# Patient Record
Sex: Female | Born: 1960
Health system: Southern US, Community
[De-identification: ages and names within clinical notes are randomized; demographics above are authoritative.]

## PROBLEM LIST (undated history)

## (undated) DIAGNOSIS — I780 Hereditary hemorrhagic telangiectasia: Secondary | ICD-10-CM

## (undated) DIAGNOSIS — E7800 Pure hypercholesterolemia, unspecified: Secondary | ICD-10-CM

## (undated) DIAGNOSIS — D496 Neoplasm of unspecified behavior of brain: Secondary | ICD-10-CM

## (undated) DIAGNOSIS — D649 Anemia, unspecified: Secondary | ICD-10-CM

## (undated) DIAGNOSIS — I639 Cerebral infarction, unspecified: Secondary | ICD-10-CM

## (undated) DIAGNOSIS — M1990 Unspecified osteoarthritis, unspecified site: Secondary | ICD-10-CM

## (undated) DIAGNOSIS — C801 Malignant (primary) neoplasm, unspecified: Secondary | ICD-10-CM

## (undated) HISTORY — PX: BRAIN TUMOR EXCISION: SHX577

## (undated) HISTORY — PX: ABDOMINAL HYSTERECTOMY: SHX81

---

## 2007-07-19 ENCOUNTER — Ambulatory Visit: Payer: Self-pay | Admitting: Cardiology

## 2007-09-29 ENCOUNTER — Ambulatory Visit (HOSPITAL_COMMUNITY): Admission: RE | Admit: 2007-09-29 | Discharge: 2007-09-29 | Payer: Self-pay | Admitting: Urology

## 2010-06-16 NOTE — Op Note (Signed)
NAMEKAMALJIT, HIZER              ACCOUNT NO.:  [id number]   MEDICAL RECORD NO.:  [id number]          PATIENT TYPE:  AMB   LOCATION:  DAY                           FACILITY:  [facility]   PHYSICIAN:  Ky Barban, M.D.DATE OF BIRTH:  1960/12/06   DATE OF PROCEDURE:  DATE OF DISCHARGE:                               OPERATIVE REPORT   PREOPERATIVE DIAGNOSES:  1. Recurrent urinary tract infection.  2. Bladder neck polyps.   POSTOPERATIVE DIAGNOSES:  1. Recurrent urinary tract infection.  2. Bladder neck polyps.  3. Urethral stenosis.   PROCEDURE:  1. Cystoscopy.  2. Fulguration of bladder neck polyps.  3. Dilation of urethra.   ANESTHESIA:  General.   PROCEDURE:  The patient underwent general endotracheal anesthesia in  lithotomy position.  Usual prep and drape.  A #25 cystoscope introduced  into the bladder.  It was thoroughly inspected.  No tumor, stone,  foreign body, or inflammation seen.  Both ureteral orifices were located  at normal side with a clear efflux.  The bladder neck shows chronic  inflammatory changes extending into the proximal urethra.  There are  chronic inflammatory polyps hanging from the bladder neck.  Using the  [facility] electrode, the bladder neck was circumferentially fulgurated.  Then, the urethra, which was calibrated with 16-French, was dilated with  32-French with straight sounds.  Bimanual pelvic exam was done, which  was unremarkable.  All of the instruments were removed.  The patient  left the operating room in satisfactory condition.      Ky Barban, M.D.  Electronically Signed     MIJ/MEDQ  D:  09/29/2007  T:  09/30/2007  Job:  [id number]

## 2010-06-16 NOTE — Consult Note (Signed)
Danielle, [name]              ACCOUNT NO.:  [id number]   MEDICAL RECORD NO.:  [id number]          PATIENT TYPE:  AMB   LOCATION:  DAY                           FACILITY:  [facility]   PHYSICIAN:  Ky Barban, M.D.DATE OF BIRTH:  03/10/1960   DATE OF CONSULTATION:  DATE OF DISCHARGE:                                 CONSULTATION   This patient is coming in the morning to have cystoscopy done under  anesthesia.   HISTORY:  A 50 year old female who is having recurrent urinary tract  infection, history of gross hematuria.  Workup with CT abdomen and  pelvis without contrast was negative.  Cystoscopy shows that she has a  bladder neck polyp.  She has several UTIs with positive cultures.  She  also had gross hematuria, so I have advised her to undergo fulguration  of this polyp.  She is coming as an outpatient under anesthesia.  We  will go ahead and fulgurate this polyp.   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  She has several other AV malformations in various organs.  She had  history of GI bleed and nose bleeding, etc.  No history of diabetes or  hypertension.   PAST SURGICAL HISTORY:  She had a stone basket done 15 years ago.   PERSONAL HISTORY:  Does not smoke or drink.   REVIEW OF SYSTEMS:  Unremarkable.   PHYSICAL EXAMINATION:  VITAL SIGNS:  Blood pressure 91/53, temperature  98.2.  CENTRAL NERVOUS SYSTEM:  Negative.  HEAD, NECK, EYES, AND ENT:  Negative.  CHEST:  Symmetrical.  HEART:  Regular sinus rhythm, no murmur.  ABDOMEN:  Soft, flat.  Liver, spleen, and kidneys are not palpable.  No  CVA tenderness.  PELVIC:  No adnexal mass or tenderness.   IMPRESSION:  Recurrent urinary tract infections.   PLAN:  Cystofulguration of bladder neck polyps.  It will be done under  the anesthesia as an outpatient.   NOTE:  She is allergic to SULFA drugs.      Ky Barban, M.D.  Electronically Signed     MIJ/MEDQ  D:  09/28/2007   T:  09/29/2007  Job:  [id number]

## 2012-08-03 DIAGNOSIS — R079 Chest pain, unspecified: Secondary | ICD-10-CM

## 2014-05-20 ENCOUNTER — Emergency Department (HOSPITAL_COMMUNITY): Payer: BLUE CROSS/BLUE SHIELD

## 2014-05-20 ENCOUNTER — Emergency Department (HOSPITAL_COMMUNITY)
Admission: EM | Admit: 2014-05-20 | Discharge: 2014-05-20 | Disposition: A | Discharge status: Home / Self Care | Payer: BLUE CROSS/BLUE SHIELD | Attending: Emergency Medicine | Admitting: Emergency Medicine

## 2014-05-20 ENCOUNTER — Encounter (HOSPITAL_COMMUNITY): Payer: Self-pay | Admitting: Emergency Medicine

## 2014-05-20 DIAGNOSIS — S73102A Unspecified sprain of left hip, initial encounter: Secondary | ICD-10-CM | POA: Diagnosis not present

## 2014-05-20 DIAGNOSIS — M25552 Pain in left hip: Secondary | ICD-10-CM

## 2014-05-20 DIAGNOSIS — Z8679 Personal history of other diseases of the circulatory system: Secondary | ICD-10-CM | POA: Diagnosis not present

## 2014-05-20 DIAGNOSIS — Y9389 Activity, other specified: Secondary | ICD-10-CM | POA: Diagnosis not present

## 2014-05-20 DIAGNOSIS — S3992XA Unspecified injury of lower back, initial encounter: Secondary | ICD-10-CM | POA: Insufficient documentation

## 2014-05-20 DIAGNOSIS — S32020A Wedge compression fracture of second lumbar vertebra, initial encounter for closed fracture: Secondary | ICD-10-CM | POA: Diagnosis not present

## 2014-05-20 DIAGNOSIS — Z8739 Personal history of other diseases of the musculoskeletal system and connective tissue: Secondary | ICD-10-CM | POA: Diagnosis not present

## 2014-05-20 DIAGNOSIS — Z862 Personal history of diseases of the blood and blood-forming organs and certain disorders involving the immune mechanism: Secondary | ICD-10-CM | POA: Insufficient documentation

## 2014-05-20 DIAGNOSIS — M549 Dorsalgia, unspecified: Secondary | ICD-10-CM

## 2014-05-20 DIAGNOSIS — Z86011 Personal history of benign neoplasm of the brain: Secondary | ICD-10-CM | POA: Diagnosis not present

## 2014-05-20 DIAGNOSIS — Z8639 Personal history of other endocrine, nutritional and metabolic disease: Secondary | ICD-10-CM | POA: Insufficient documentation

## 2014-05-20 DIAGNOSIS — Y92410 Unspecified street and highway as the place of occurrence of the external cause: Secondary | ICD-10-CM | POA: Diagnosis not present

## 2014-05-20 DIAGNOSIS — Y998 Other external cause status: Secondary | ICD-10-CM | POA: Diagnosis not present

## 2014-05-20 DIAGNOSIS — S32000A Wedge compression fracture of unspecified lumbar vertebra, initial encounter for closed fracture: Secondary | ICD-10-CM

## 2014-05-20 DIAGNOSIS — Z8673 Personal history of transient ischemic attack (TIA), and cerebral infarction without residual deficits: Secondary | ICD-10-CM | POA: Insufficient documentation

## 2014-05-20 DIAGNOSIS — Z859 Personal history of malignant neoplasm, unspecified: Secondary | ICD-10-CM | POA: Diagnosis not present

## 2014-05-20 DIAGNOSIS — S79912A Unspecified injury of left hip, initial encounter: Secondary | ICD-10-CM | POA: Diagnosis present

## 2014-05-20 HISTORY — DX: Cerebral infarction, unspecified: I63.9

## 2014-05-20 HISTORY — DX: Pure hypercholesterolemia, unspecified: E78.00

## 2014-05-20 HISTORY — DX: Neoplasm of unspecified behavior of brain: D49.6

## 2014-05-20 HISTORY — DX: Anemia, unspecified: D64.9

## 2014-05-20 HISTORY — DX: Hereditary hemorrhagic telangiectasia: I78.0

## 2014-05-20 HISTORY — DX: Malignant (primary) neoplasm, unspecified: C80.1

## 2014-05-20 HISTORY — DX: Unspecified osteoarthritis, unspecified site: M19.90

## 2014-05-20 MED ORDER — ONDANSETRON 8 MG PO TBDP
8.0000 mg | ORAL_TABLET | Freq: Once | ORAL | Status: AC
  Administered 2014-05-20: 8 mg via ORAL
  Filled 2014-05-20: qty 1

## 2014-05-20 MED ORDER — HYDROMORPHONE HCL 1 MG/ML IJ SOLN
1.0000 mg | Freq: Once | INTRAMUSCULAR | Status: AC
Start: 2014-05-20 — End: 2014-05-20
  Administered 2014-05-20: 1 mg via INTRAMUSCULAR
  Filled 2014-05-20: qty 1

## 2014-05-20 MED ORDER — OXYCODONE-ACETAMINOPHEN 5-325 MG PO TABS: 1.0000 | ORAL_TABLET | ORAL | Status: AC | PRN

## 2014-05-20 MED ORDER — ONDANSETRON 4 MG PO TBDP
ORAL_TABLET | ORAL | Status: AC
  Filled 2014-05-20: qty 1

## 2014-05-20 MED ORDER — OXYCODONE-ACETAMINOPHEN 5-325 MG PO TABS
1.0000 | ORAL_TABLET | Freq: Once | ORAL | Status: AC
  Administered 2014-05-20: 1 via ORAL
  Filled 2014-05-20: qty 1
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
## 2014-05-20 NOTE — ED Notes (Signed)
MD at bedside. 
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
## 2014-05-20 NOTE — ED Provider Notes (Signed)
Pt up to bedside commode.   C/o Back pain.  No weakness, numbness.  Just given IM meds.  Plan will be pain control, home, rest, NS follow up do discuss TLSO or Kyphoplasty if pain persists.   Tanna Furry, MD 05/20/14 [phone]

## 2017-07-14 DIAGNOSIS — Z01419 Encounter for gynecological examination (general) (routine) without abnormal findings: Secondary | ICD-10-CM | POA: Diagnosis not present

## 2017-08-18 DIAGNOSIS — D519 Vitamin B12 deficiency anemia, unspecified: Secondary | ICD-10-CM | POA: Diagnosis not present

## 2017-08-18 DIAGNOSIS — Z1231 Encounter for screening mammogram for malignant neoplasm of breast: Secondary | ICD-10-CM | POA: Diagnosis not present

## 2017-08-18 DIAGNOSIS — R5382 Chronic fatigue, unspecified: Secondary | ICD-10-CM | POA: Diagnosis not present

## 2017-08-18 DIAGNOSIS — D509 Iron deficiency anemia, unspecified: Secondary | ICD-10-CM | POA: Diagnosis not present

## 2017-08-18 DIAGNOSIS — E559 Vitamin D deficiency, unspecified: Secondary | ICD-10-CM | POA: Diagnosis not present

## 2017-08-24 DIAGNOSIS — L570 Actinic keratosis: Secondary | ICD-10-CM | POA: Diagnosis not present

## 2017-08-25 DIAGNOSIS — Z6823 Body mass index (BMI) 23.0-23.9, adult: Secondary | ICD-10-CM | POA: Diagnosis not present

## 2017-08-25 DIAGNOSIS — Z0001 Encounter for general adult medical examination with abnormal findings: Secondary | ICD-10-CM | POA: Diagnosis not present

## 2017-08-25 DIAGNOSIS — Z23 Encounter for immunization: Secondary | ICD-10-CM | POA: Diagnosis not present

## 2017-10-13 DIAGNOSIS — Q282 Arteriovenous malformation of cerebral vessels: Secondary | ICD-10-CM | POA: Diagnosis not present

## 2017-10-13 DIAGNOSIS — D333 Benign neoplasm of cranial nerves: Secondary | ICD-10-CM | POA: Diagnosis not present

## 2017-10-13 DIAGNOSIS — Z483 Aftercare following surgery for neoplasm: Secondary | ICD-10-CM | POA: Diagnosis not present

## 2017-10-13 DIAGNOSIS — I82891 Chronic embolism and thrombosis of other specified veins: Secondary | ICD-10-CM | POA: Diagnosis not present

## 2017-10-26 DIAGNOSIS — R040 Epistaxis: Secondary | ICD-10-CM | POA: Diagnosis not present

## 2017-10-26 DIAGNOSIS — D500 Iron deficiency anemia secondary to blood loss (chronic): Secondary | ICD-10-CM | POA: Diagnosis not present

## 2017-11-02 DIAGNOSIS — J342 Deviated nasal septum: Secondary | ICD-10-CM | POA: Diagnosis not present

## 2017-11-02 DIAGNOSIS — R040 Epistaxis: Secondary | ICD-10-CM | POA: Diagnosis not present

## 2017-11-22 DIAGNOSIS — J342 Deviated nasal septum: Secondary | ICD-10-CM | POA: Diagnosis not present

## 2017-11-22 DIAGNOSIS — R040 Epistaxis: Secondary | ICD-10-CM | POA: Diagnosis not present

## 2017-11-28 DIAGNOSIS — J342 Deviated nasal septum: Secondary | ICD-10-CM | POA: Diagnosis not present

## 2017-11-28 DIAGNOSIS — R040 Epistaxis: Secondary | ICD-10-CM | POA: Diagnosis not present

## 2017-12-06 DIAGNOSIS — Z79899 Other long term (current) drug therapy: Secondary | ICD-10-CM | POA: Diagnosis not present

## 2017-12-07 DIAGNOSIS — R040 Epistaxis: Secondary | ICD-10-CM | POA: Diagnosis not present

## 2017-12-07 DIAGNOSIS — Z23 Encounter for immunization: Secondary | ICD-10-CM | POA: Diagnosis not present

## 2017-12-09 DIAGNOSIS — R002 Palpitations: Secondary | ICD-10-CM | POA: Diagnosis not present

## 2017-12-09 DIAGNOSIS — E7800 Pure hypercholesterolemia, unspecified: Secondary | ICD-10-CM | POA: Diagnosis not present

## 2017-12-21 DIAGNOSIS — R300 Dysuria: Secondary | ICD-10-CM | POA: Diagnosis not present

## 2018-01-04 DIAGNOSIS — N390 Urinary tract infection, site not specified: Secondary | ICD-10-CM | POA: Diagnosis not present

## 2018-01-04 DIAGNOSIS — Z87440 Personal history of urinary (tract) infections: Secondary | ICD-10-CM | POA: Diagnosis not present

## 2018-01-05 DIAGNOSIS — R002 Palpitations: Secondary | ICD-10-CM | POA: Diagnosis not present

## 2018-01-18 DIAGNOSIS — R3912 Poor urinary stream: Secondary | ICD-10-CM | POA: Diagnosis not present

## 2018-01-18 DIAGNOSIS — R3915 Urgency of urination: Secondary | ICD-10-CM | POA: Diagnosis not present

## 2018-01-23 DIAGNOSIS — R002 Palpitations: Secondary | ICD-10-CM | POA: Diagnosis not present

## 2018-02-05 DIAGNOSIS — I491 Atrial premature depolarization: Secondary | ICD-10-CM | POA: Diagnosis not present

## 2018-02-05 DIAGNOSIS — R002 Palpitations: Secondary | ICD-10-CM | POA: Diagnosis not present

## 2018-02-05 DIAGNOSIS — I493 Ventricular premature depolarization: Secondary | ICD-10-CM | POA: Diagnosis not present

## 2018-02-09 DIAGNOSIS — K219 Gastro-esophageal reflux disease without esophagitis: Secondary | ICD-10-CM | POA: Diagnosis not present

## 2018-02-09 DIAGNOSIS — D529 Folate deficiency anemia, unspecified: Secondary | ICD-10-CM | POA: Diagnosis not present

## 2018-02-09 DIAGNOSIS — E782 Mixed hyperlipidemia: Secondary | ICD-10-CM | POA: Diagnosis not present

## 2018-02-09 DIAGNOSIS — D519 Vitamin B12 deficiency anemia, unspecified: Secondary | ICD-10-CM | POA: Diagnosis not present

## 2018-02-09 DIAGNOSIS — D509 Iron deficiency anemia, unspecified: Secondary | ICD-10-CM | POA: Diagnosis not present

## 2018-02-14 DIAGNOSIS — G464 Cerebellar stroke syndrome: Secondary | ICD-10-CM | POA: Diagnosis not present

## 2018-02-14 DIAGNOSIS — I770 Arteriovenous fistula, acquired: Secondary | ICD-10-CM | POA: Diagnosis not present

## 2018-02-14 DIAGNOSIS — D509 Iron deficiency anemia, unspecified: Secondary | ICD-10-CM | POA: Diagnosis not present

## 2018-02-21 DIAGNOSIS — R3915 Urgency of urination: Secondary | ICD-10-CM | POA: Diagnosis not present

## 2018-03-01 DIAGNOSIS — D500 Iron deficiency anemia secondary to blood loss (chronic): Secondary | ICD-10-CM | POA: Diagnosis not present

## 2018-03-01 DIAGNOSIS — Z483 Aftercare following surgery for neoplasm: Secondary | ICD-10-CM | POA: Diagnosis not present

## 2018-03-01 DIAGNOSIS — K5520 Angiodysplasia of colon without hemorrhage: Secondary | ICD-10-CM | POA: Diagnosis not present

## 2018-03-01 DIAGNOSIS — Q282 Arteriovenous malformation of cerebral vessels: Secondary | ICD-10-CM | POA: Diagnosis not present

## 2018-03-15 DIAGNOSIS — K5520 Angiodysplasia of colon without hemorrhage: Secondary | ICD-10-CM | POA: Diagnosis not present

## 2018-03-15 DIAGNOSIS — R040 Epistaxis: Secondary | ICD-10-CM | POA: Diagnosis not present

## 2018-03-29 DIAGNOSIS — I69398 Other sequelae of cerebral infarction: Secondary | ICD-10-CM | POA: Diagnosis not present

## 2018-03-29 DIAGNOSIS — Q282 Arteriovenous malformation of cerebral vessels: Secondary | ICD-10-CM | POA: Diagnosis not present

## 2018-03-29 DIAGNOSIS — G43109 Migraine with aura, not intractable, without status migrainosus: Secondary | ICD-10-CM | POA: Diagnosis not present

## 2018-05-02 DIAGNOSIS — R040 Epistaxis: Secondary | ICD-10-CM | POA: Diagnosis not present

## 2018-05-02 DIAGNOSIS — K5520 Angiodysplasia of colon without hemorrhage: Secondary | ICD-10-CM | POA: Diagnosis not present

## 2018-06-01 DIAGNOSIS — R040 Epistaxis: Secondary | ICD-10-CM | POA: Diagnosis not present

## 2018-06-01 DIAGNOSIS — K5520 Angiodysplasia of colon without hemorrhage: Secondary | ICD-10-CM | POA: Diagnosis not present

## 2018-06-14 DIAGNOSIS — D72819 Decreased white blood cell count, unspecified: Secondary | ICD-10-CM | POA: Diagnosis not present

## 2018-06-21 DIAGNOSIS — J342 Deviated nasal septum: Secondary | ICD-10-CM | POA: Diagnosis not present

## 2018-06-21 DIAGNOSIS — R040 Epistaxis: Secondary | ICD-10-CM | POA: Diagnosis not present

## 2018-07-13 DIAGNOSIS — D72810 Lymphocytopenia: Secondary | ICD-10-CM | POA: Diagnosis not present

## 2018-07-13 DIAGNOSIS — Z118 Encounter for screening for other infectious and parasitic diseases: Secondary | ICD-10-CM | POA: Diagnosis not present

## 2018-07-13 DIAGNOSIS — Z1321 Encounter for screening for nutritional disorder: Secondary | ICD-10-CM | POA: Diagnosis not present

## 2018-07-13 DIAGNOSIS — I780 Hereditary hemorrhagic telangiectasia: Secondary | ICD-10-CM | POA: Diagnosis not present

## 2018-07-13 DIAGNOSIS — D500 Iron deficiency anemia secondary to blood loss (chronic): Secondary | ICD-10-CM | POA: Diagnosis not present

## 2018-07-13 DIAGNOSIS — Z114 Encounter for screening for human immunodeficiency virus [HIV]: Secondary | ICD-10-CM | POA: Diagnosis not present

## 2018-07-13 DIAGNOSIS — D72819 Decreased white blood cell count, unspecified: Secondary | ICD-10-CM | POA: Diagnosis not present

## 2018-08-24 DIAGNOSIS — Z01419 Encounter for gynecological examination (general) (routine) without abnormal findings: Secondary | ICD-10-CM | POA: Diagnosis not present

## 2018-08-24 DIAGNOSIS — Z1212 Encounter for screening for malignant neoplasm of rectum: Secondary | ICD-10-CM | POA: Diagnosis not present

## 2018-08-24 DIAGNOSIS — Z1231 Encounter for screening mammogram for malignant neoplasm of breast: Secondary | ICD-10-CM | POA: Diagnosis not present

## 2018-08-25 DIAGNOSIS — Z0001 Encounter for general adult medical examination with abnormal findings: Secondary | ICD-10-CM | POA: Diagnosis not present

## 2018-08-28 DIAGNOSIS — R3912 Poor urinary stream: Secondary | ICD-10-CM | POA: Diagnosis not present

## 2018-08-28 DIAGNOSIS — R3915 Urgency of urination: Secondary | ICD-10-CM | POA: Diagnosis not present

## 2018-08-28 DIAGNOSIS — Z23 Encounter for immunization: Secondary | ICD-10-CM | POA: Diagnosis not present

## 2018-08-28 DIAGNOSIS — Z6824 Body mass index (BMI) 24.0-24.9, adult: Secondary | ICD-10-CM | POA: Diagnosis not present

## 2018-08-28 DIAGNOSIS — Z0001 Encounter for general adult medical examination with abnormal findings: Secondary | ICD-10-CM | POA: Diagnosis not present

## 2018-08-29 DIAGNOSIS — L570 Actinic keratosis: Secondary | ICD-10-CM | POA: Diagnosis not present

## 2018-09-21 DIAGNOSIS — D500 Iron deficiency anemia secondary to blood loss (chronic): Secondary | ICD-10-CM | POA: Diagnosis not present

## 2018-09-21 DIAGNOSIS — I780 Hereditary hemorrhagic telangiectasia: Secondary | ICD-10-CM | POA: Diagnosis not present

## 2018-09-26 DIAGNOSIS — I69354 Hemiplegia and hemiparesis following cerebral infarction affecting left non-dominant side: Secondary | ICD-10-CM | POA: Diagnosis not present

## 2018-09-26 DIAGNOSIS — E7800 Pure hypercholesterolemia, unspecified: Secondary | ICD-10-CM | POA: Diagnosis not present

## 2018-09-26 DIAGNOSIS — G43109 Migraine with aura, not intractable, without status migrainosus: Secondary | ICD-10-CM | POA: Diagnosis not present

## 2018-09-26 DIAGNOSIS — Z8774 Personal history of (corrected) congenital malformations of heart and circulatory system: Secondary | ICD-10-CM | POA: Diagnosis not present

## 2018-10-12 DIAGNOSIS — G510 Bell's palsy: Secondary | ICD-10-CM | POA: Diagnosis not present

## 2018-10-12 DIAGNOSIS — I780 Hereditary hemorrhagic telangiectasia: Secondary | ICD-10-CM | POA: Diagnosis not present

## 2018-10-12 DIAGNOSIS — K219 Gastro-esophageal reflux disease without esophagitis: Secondary | ICD-10-CM | POA: Diagnosis not present

## 2018-10-12 DIAGNOSIS — H918X2 Other specified hearing loss, left ear: Secondary | ICD-10-CM | POA: Diagnosis not present

## 2018-10-12 DIAGNOSIS — G43909 Migraine, unspecified, not intractable, without status migrainosus: Secondary | ICD-10-CM | POA: Diagnosis not present

## 2018-10-12 DIAGNOSIS — E785 Hyperlipidemia, unspecified: Secondary | ICD-10-CM | POA: Diagnosis not present

## 2018-10-12 DIAGNOSIS — K5790 Diverticulosis of intestine, part unspecified, without perforation or abscess without bleeding: Secondary | ICD-10-CM | POA: Diagnosis not present

## 2018-10-12 DIAGNOSIS — Z8673 Personal history of transient ischemic attack (TIA), and cerebral infarction without residual deficits: Secondary | ICD-10-CM | POA: Diagnosis not present

## 2018-10-12 DIAGNOSIS — D72819 Decreased white blood cell count, unspecified: Secondary | ICD-10-CM | POA: Diagnosis not present

## 2018-10-12 DIAGNOSIS — D500 Iron deficiency anemia secondary to blood loss (chronic): Secondary | ICD-10-CM | POA: Diagnosis not present

## 2018-10-12 DIAGNOSIS — D649 Anemia, unspecified: Secondary | ICD-10-CM | POA: Diagnosis not present

## 2018-10-16 DIAGNOSIS — D500 Iron deficiency anemia secondary to blood loss (chronic): Secondary | ICD-10-CM | POA: Diagnosis not present

## 2018-10-16 DIAGNOSIS — I780 Hereditary hemorrhagic telangiectasia: Secondary | ICD-10-CM | POA: Diagnosis not present

## 2018-11-02 DIAGNOSIS — Z8673 Personal history of transient ischemic attack (TIA), and cerebral infarction without residual deficits: Secondary | ICD-10-CM | POA: Diagnosis not present

## 2018-11-02 DIAGNOSIS — Z79899 Other long term (current) drug therapy: Secondary | ICD-10-CM | POA: Diagnosis not present

## 2018-11-02 DIAGNOSIS — K219 Gastro-esophageal reflux disease without esophagitis: Secondary | ICD-10-CM | POA: Diagnosis not present

## 2018-11-02 DIAGNOSIS — J329 Chronic sinusitis, unspecified: Secondary | ICD-10-CM | POA: Diagnosis not present

## 2018-11-02 DIAGNOSIS — J342 Deviated nasal septum: Secondary | ICD-10-CM | POA: Diagnosis not present

## 2018-11-02 DIAGNOSIS — I780 Hereditary hemorrhagic telangiectasia: Secondary | ICD-10-CM | POA: Diagnosis not present

## 2018-11-02 DIAGNOSIS — Z7722 Contact with and (suspected) exposure to environmental tobacco smoke (acute) (chronic): Secondary | ICD-10-CM | POA: Diagnosis not present

## 2018-11-02 DIAGNOSIS — R040 Epistaxis: Secondary | ICD-10-CM | POA: Diagnosis not present

## 2018-11-02 DIAGNOSIS — K5520 Angiodysplasia of colon without hemorrhage: Secondary | ICD-10-CM | POA: Diagnosis not present

## 2018-11-20 ENCOUNTER — Other Ambulatory Visit: Payer: Self-pay

## 2018-11-20 ENCOUNTER — Other Ambulatory Visit: Payer: Self-pay | Admitting: Family Medicine

## 2018-11-20 ENCOUNTER — Ambulatory Visit (HOSPITAL_COMMUNITY)
Admission: RE | Admit: 2018-11-20 | Discharge: 2018-11-20 | Disposition: A | Discharge status: Home / Self Care | Payer: BC Managed Care – PPO | Source: Ambulatory Visit | Attending: Family Medicine | Admitting: Family Medicine

## 2018-11-20 DIAGNOSIS — M79605 Pain in left leg: Secondary | ICD-10-CM | POA: Diagnosis not present

## 2018-11-20 DIAGNOSIS — M79662 Pain in left lower leg: Secondary | ICD-10-CM | POA: Diagnosis not present

## 2018-11-20 DIAGNOSIS — Z6825 Body mass index (BMI) 25.0-25.9, adult: Secondary | ICD-10-CM | POA: Diagnosis not present

## 2018-11-20 DIAGNOSIS — R52 Pain, unspecified: Secondary | ICD-10-CM | POA: Diagnosis not present

## 2018-12-15 DIAGNOSIS — Q282 Arteriovenous malformation of cerebral vessels: Secondary | ICD-10-CM | POA: Diagnosis not present

## 2018-12-15 DIAGNOSIS — I780 Hereditary hemorrhagic telangiectasia: Secondary | ICD-10-CM | POA: Diagnosis not present

## 2018-12-15 DIAGNOSIS — Z09 Encounter for follow-up examination after completed treatment for conditions other than malignant neoplasm: Secondary | ICD-10-CM | POA: Diagnosis not present

## 2018-12-15 DIAGNOSIS — Z8673 Personal history of transient ischemic attack (TIA), and cerebral infarction without residual deficits: Secondary | ICD-10-CM | POA: Diagnosis not present

## 2018-12-15 DIAGNOSIS — R002 Palpitations: Secondary | ICD-10-CM | POA: Diagnosis not present

## 2018-12-15 DIAGNOSIS — E785 Hyperlipidemia, unspecified: Secondary | ICD-10-CM | POA: Diagnosis not present

## 2018-12-15 DIAGNOSIS — Q2733 Arteriovenous malformation of digestive system vessel: Secondary | ICD-10-CM | POA: Diagnosis not present

## 2018-12-15 DIAGNOSIS — K5520 Angiodysplasia of colon without hemorrhage: Secondary | ICD-10-CM | POA: Diagnosis not present

## 2018-12-15 DIAGNOSIS — D649 Anemia, unspecified: Secondary | ICD-10-CM | POA: Diagnosis not present

## 2018-12-15 DIAGNOSIS — M791 Myalgia, unspecified site: Secondary | ICD-10-CM | POA: Diagnosis not present

## 2018-12-15 DIAGNOSIS — I10 Essential (primary) hypertension: Secondary | ICD-10-CM | POA: Diagnosis not present

## 2018-12-19 DIAGNOSIS — R040 Epistaxis: Secondary | ICD-10-CM | POA: Diagnosis not present

## 2018-12-19 DIAGNOSIS — J343 Hypertrophy of nasal turbinates: Secondary | ICD-10-CM | POA: Diagnosis not present

## 2018-12-19 DIAGNOSIS — R0982 Postnasal drip: Secondary | ICD-10-CM | POA: Diagnosis not present

## 2018-12-19 DIAGNOSIS — J342 Deviated nasal septum: Secondary | ICD-10-CM | POA: Diagnosis not present

## 2018-12-19 DIAGNOSIS — I780 Hereditary hemorrhagic telangiectasia: Secondary | ICD-10-CM | POA: Diagnosis not present

## 2019-01-11 DIAGNOSIS — K5900 Constipation, unspecified: Secondary | ICD-10-CM | POA: Diagnosis not present

## 2019-01-11 DIAGNOSIS — I780 Hereditary hemorrhagic telangiectasia: Secondary | ICD-10-CM | POA: Diagnosis not present

## 2019-01-11 DIAGNOSIS — K6289 Other specified diseases of anus and rectum: Secondary | ICD-10-CM | POA: Diagnosis not present

## 2019-01-11 DIAGNOSIS — K648 Other hemorrhoids: Secondary | ICD-10-CM | POA: Diagnosis not present

## 2019-02-20 DIAGNOSIS — E782 Mixed hyperlipidemia: Secondary | ICD-10-CM | POA: Diagnosis not present

## 2019-02-20 DIAGNOSIS — D519 Vitamin B12 deficiency anemia, unspecified: Secondary | ICD-10-CM | POA: Diagnosis not present

## 2019-02-20 DIAGNOSIS — D649 Anemia, unspecified: Secondary | ICD-10-CM | POA: Diagnosis not present

## 2019-02-20 DIAGNOSIS — D529 Folate deficiency anemia, unspecified: Secondary | ICD-10-CM | POA: Diagnosis not present

## 2019-02-23 DIAGNOSIS — E782 Mixed hyperlipidemia: Secondary | ICD-10-CM | POA: Diagnosis not present

## 2019-02-23 DIAGNOSIS — M5416 Radiculopathy, lumbar region: Secondary | ICD-10-CM | POA: Diagnosis not present

## 2019-02-23 DIAGNOSIS — D509 Iron deficiency anemia, unspecified: Secondary | ICD-10-CM | POA: Diagnosis not present

## 2019-02-23 DIAGNOSIS — D519 Vitamin B12 deficiency anemia, unspecified: Secondary | ICD-10-CM | POA: Diagnosis not present

## 2019-03-08 DIAGNOSIS — I780 Hereditary hemorrhagic telangiectasia: Secondary | ICD-10-CM | POA: Diagnosis not present

## 2019-03-08 DIAGNOSIS — Q2730 Arteriovenous malformation, site unspecified: Secondary | ICD-10-CM | POA: Diagnosis not present

## 2019-03-08 DIAGNOSIS — D500 Iron deficiency anemia secondary to blood loss (chronic): Secondary | ICD-10-CM | POA: Diagnosis not present

## 2019-03-08 DIAGNOSIS — D72819 Decreased white blood cell count, unspecified: Secondary | ICD-10-CM | POA: Diagnosis not present

## 2019-03-12 DIAGNOSIS — D500 Iron deficiency anemia secondary to blood loss (chronic): Secondary | ICD-10-CM | POA: Diagnosis not present

## 2019-03-19 DIAGNOSIS — D500 Iron deficiency anemia secondary to blood loss (chronic): Secondary | ICD-10-CM | POA: Diagnosis not present

## 2019-03-28 DIAGNOSIS — G249 Dystonia, unspecified: Secondary | ICD-10-CM | POA: Diagnosis not present

## 2019-03-28 DIAGNOSIS — I780 Hereditary hemorrhagic telangiectasia: Secondary | ICD-10-CM | POA: Diagnosis not present

## 2019-03-28 DIAGNOSIS — Z7982 Long term (current) use of aspirin: Secondary | ICD-10-CM | POA: Diagnosis not present

## 2019-03-28 DIAGNOSIS — G43109 Migraine with aura, not intractable, without status migrainosus: Secondary | ICD-10-CM | POA: Diagnosis not present

## 2019-03-28 DIAGNOSIS — E785 Hyperlipidemia, unspecified: Secondary | ICD-10-CM | POA: Diagnosis not present

## 2019-03-28 DIAGNOSIS — Z79899 Other long term (current) drug therapy: Secondary | ICD-10-CM | POA: Diagnosis not present

## 2019-03-28 DIAGNOSIS — I63131 Cerebral infarction due to embolism of right carotid artery: Secondary | ICD-10-CM | POA: Diagnosis not present

## 2019-03-28 DIAGNOSIS — I10 Essential (primary) hypertension: Secondary | ICD-10-CM | POA: Diagnosis not present

## 2019-03-28 DIAGNOSIS — I69354 Hemiplegia and hemiparesis following cerebral infarction affecting left non-dominant side: Secondary | ICD-10-CM | POA: Diagnosis not present

## 2019-03-28 DIAGNOSIS — I69819 Unspecified symptoms and signs involving cognitive functions following other cerebrovascular disease: Secondary | ICD-10-CM | POA: Diagnosis not present

## 2019-03-28 DIAGNOSIS — E7800 Pure hypercholesterolemia, unspecified: Secondary | ICD-10-CM | POA: Diagnosis not present

## 2019-03-28 DIAGNOSIS — Z7722 Contact with and (suspected) exposure to environmental tobacco smoke (acute) (chronic): Secondary | ICD-10-CM | POA: Diagnosis not present

## 2019-04-23 DIAGNOSIS — I780 Hereditary hemorrhagic telangiectasia: Secondary | ICD-10-CM | POA: Diagnosis not present

## 2019-05-08 DIAGNOSIS — I780 Hereditary hemorrhagic telangiectasia: Secondary | ICD-10-CM | POA: Diagnosis not present

## 2019-05-08 DIAGNOSIS — Z20822 Contact with and (suspected) exposure to covid-19: Secondary | ICD-10-CM | POA: Diagnosis not present

## 2019-05-08 DIAGNOSIS — Z01812 Encounter for preprocedural laboratory examination: Secondary | ICD-10-CM | POA: Diagnosis not present

## 2019-05-15 DIAGNOSIS — K31811 Angiodysplasia of stomach and duodenum with bleeding: Secondary | ICD-10-CM | POA: Diagnosis not present

## 2019-05-15 DIAGNOSIS — Z7982 Long term (current) use of aspirin: Secondary | ICD-10-CM | POA: Diagnosis not present

## 2019-05-15 DIAGNOSIS — I780 Hereditary hemorrhagic telangiectasia: Secondary | ICD-10-CM | POA: Diagnosis not present

## 2019-05-15 DIAGNOSIS — Q2733 Arteriovenous malformation of digestive system vessel: Secondary | ICD-10-CM | POA: Diagnosis not present

## 2019-05-15 DIAGNOSIS — D500 Iron deficiency anemia secondary to blood loss (chronic): Secondary | ICD-10-CM | POA: Diagnosis not present

## 2019-06-07 DIAGNOSIS — I780 Hereditary hemorrhagic telangiectasia: Secondary | ICD-10-CM | POA: Diagnosis not present

## 2019-06-07 DIAGNOSIS — D72819 Decreased white blood cell count, unspecified: Secondary | ICD-10-CM | POA: Diagnosis not present

## 2019-06-07 DIAGNOSIS — K5520 Angiodysplasia of colon without hemorrhage: Secondary | ICD-10-CM | POA: Diagnosis not present

## 2019-06-07 DIAGNOSIS — D500 Iron deficiency anemia secondary to blood loss (chronic): Secondary | ICD-10-CM | POA: Diagnosis not present

## 2019-06-19 DIAGNOSIS — J342 Deviated nasal septum: Secondary | ICD-10-CM | POA: Diagnosis not present

## 2019-06-19 DIAGNOSIS — Z882 Allergy status to sulfonamides status: Secondary | ICD-10-CM | POA: Diagnosis not present

## 2019-06-19 DIAGNOSIS — R0982 Postnasal drip: Secondary | ICD-10-CM | POA: Diagnosis not present

## 2019-06-19 DIAGNOSIS — I780 Hereditary hemorrhagic telangiectasia: Secondary | ICD-10-CM | POA: Diagnosis not present

## 2019-06-19 DIAGNOSIS — R040 Epistaxis: Secondary | ICD-10-CM | POA: Diagnosis not present

## 2019-06-19 DIAGNOSIS — Z885 Allergy status to narcotic agent status: Secondary | ICD-10-CM | POA: Diagnosis not present

## 2019-06-19 DIAGNOSIS — J343 Hypertrophy of nasal turbinates: Secondary | ICD-10-CM | POA: Diagnosis not present

## 2019-07-05 DIAGNOSIS — D72819 Decreased white blood cell count, unspecified: Secondary | ICD-10-CM | POA: Diagnosis not present

## 2019-07-05 DIAGNOSIS — R040 Epistaxis: Secondary | ICD-10-CM | POA: Diagnosis not present

## 2019-07-05 DIAGNOSIS — D500 Iron deficiency anemia secondary to blood loss (chronic): Secondary | ICD-10-CM | POA: Diagnosis not present

## 2019-07-05 DIAGNOSIS — D649 Anemia, unspecified: Secondary | ICD-10-CM | POA: Diagnosis not present

## 2019-07-05 DIAGNOSIS — Z79899 Other long term (current) drug therapy: Secondary | ICD-10-CM | POA: Diagnosis not present

## 2019-07-05 DIAGNOSIS — I780 Hereditary hemorrhagic telangiectasia: Secondary | ICD-10-CM | POA: Diagnosis not present

## 2019-07-12 DIAGNOSIS — D500 Iron deficiency anemia secondary to blood loss (chronic): Secondary | ICD-10-CM | POA: Diagnosis not present

## 2019-07-12 DIAGNOSIS — R040 Epistaxis: Secondary | ICD-10-CM | POA: Diagnosis not present

## 2019-07-12 DIAGNOSIS — I780 Hereditary hemorrhagic telangiectasia: Secondary | ICD-10-CM | POA: Diagnosis not present

## 2019-07-12 DIAGNOSIS — K5520 Angiodysplasia of colon without hemorrhage: Secondary | ICD-10-CM | POA: Diagnosis not present

## 2019-07-31 DIAGNOSIS — M810 Age-related osteoporosis without current pathological fracture: Secondary | ICD-10-CM | POA: Diagnosis not present

## 2019-07-31 DIAGNOSIS — Z0389 Encounter for observation for other suspected diseases and conditions ruled out: Secondary | ICD-10-CM | POA: Diagnosis not present

## 2019-08-28 DIAGNOSIS — L570 Actinic keratosis: Secondary | ICD-10-CM | POA: Diagnosis not present

## 2019-09-04 DIAGNOSIS — K219 Gastro-esophageal reflux disease without esophagitis: Secondary | ICD-10-CM | POA: Diagnosis not present

## 2019-09-04 DIAGNOSIS — E782 Mixed hyperlipidemia: Secondary | ICD-10-CM | POA: Diagnosis not present

## 2019-09-04 DIAGNOSIS — D529 Folate deficiency anemia, unspecified: Secondary | ICD-10-CM | POA: Diagnosis not present

## 2019-09-04 DIAGNOSIS — D649 Anemia, unspecified: Secondary | ICD-10-CM | POA: Diagnosis not present

## 2019-09-04 DIAGNOSIS — R5382 Chronic fatigue, unspecified: Secondary | ICD-10-CM | POA: Diagnosis not present

## 2019-09-04 DIAGNOSIS — D518 Other vitamin B12 deficiency anemias: Secondary | ICD-10-CM | POA: Diagnosis not present

## 2019-09-04 DIAGNOSIS — E559 Vitamin D deficiency, unspecified: Secondary | ICD-10-CM | POA: Diagnosis not present

## 2019-09-05 DIAGNOSIS — Z1212 Encounter for screening for malignant neoplasm of rectum: Secondary | ICD-10-CM | POA: Diagnosis not present

## 2019-09-05 DIAGNOSIS — Z1272 Encounter for screening for malignant neoplasm of vagina: Secondary | ICD-10-CM | POA: Diagnosis not present

## 2019-09-05 DIAGNOSIS — Z90712 Acquired absence of cervix with remaining uterus: Secondary | ICD-10-CM | POA: Diagnosis not present

## 2019-09-05 DIAGNOSIS — R87620 Atypical squamous cells of undetermined significance on cytologic smear of vagina (ASC-US): Secondary | ICD-10-CM | POA: Diagnosis not present

## 2019-09-05 DIAGNOSIS — Z1231 Encounter for screening mammogram for malignant neoplasm of breast: Secondary | ICD-10-CM | POA: Diagnosis not present

## 2019-09-05 DIAGNOSIS — Z01419 Encounter for gynecological examination (general) (routine) without abnormal findings: Secondary | ICD-10-CM | POA: Diagnosis not present

## 2019-09-07 DIAGNOSIS — Q8503 Schwannomatosis: Secondary | ICD-10-CM | POA: Diagnosis not present

## 2019-09-07 DIAGNOSIS — D519 Vitamin B12 deficiency anemia, unspecified: Secondary | ICD-10-CM | POA: Diagnosis not present

## 2019-09-07 DIAGNOSIS — I770 Arteriovenous fistula, acquired: Secondary | ICD-10-CM | POA: Diagnosis not present

## 2019-09-07 DIAGNOSIS — Z0001 Encounter for general adult medical examination with abnormal findings: Secondary | ICD-10-CM | POA: Diagnosis not present

## 2019-09-07 DIAGNOSIS — E782 Mixed hyperlipidemia: Secondary | ICD-10-CM | POA: Diagnosis not present

## 2019-09-07 DIAGNOSIS — Z23 Encounter for immunization: Secondary | ICD-10-CM | POA: Diagnosis not present

## 2019-09-13 DIAGNOSIS — D500 Iron deficiency anemia secondary to blood loss (chronic): Secondary | ICD-10-CM | POA: Diagnosis not present

## 2019-09-13 DIAGNOSIS — I780 Hereditary hemorrhagic telangiectasia: Secondary | ICD-10-CM | POA: Diagnosis not present

## 2019-10-04 DIAGNOSIS — D500 Iron deficiency anemia secondary to blood loss (chronic): Secondary | ICD-10-CM | POA: Diagnosis not present

## 2019-10-04 DIAGNOSIS — D72819 Decreased white blood cell count, unspecified: Secondary | ICD-10-CM | POA: Diagnosis not present

## 2019-10-04 DIAGNOSIS — I780 Hereditary hemorrhagic telangiectasia: Secondary | ICD-10-CM | POA: Diagnosis not present

## 2019-10-11 DIAGNOSIS — D500 Iron deficiency anemia secondary to blood loss (chronic): Secondary | ICD-10-CM | POA: Diagnosis not present

## 2019-10-18 DIAGNOSIS — D500 Iron deficiency anemia secondary to blood loss (chronic): Secondary | ICD-10-CM | POA: Diagnosis not present

## 2019-12-11 ENCOUNTER — Other Ambulatory Visit: Payer: Self-pay | Admitting: *Deleted

## 2019-12-11 DIAGNOSIS — M279 Disease of jaws, unspecified: Secondary | ICD-10-CM

## 2019-12-20 DIAGNOSIS — J342 Deviated nasal septum: Secondary | ICD-10-CM | POA: Diagnosis not present

## 2019-12-20 DIAGNOSIS — I780 Hereditary hemorrhagic telangiectasia: Secondary | ICD-10-CM | POA: Diagnosis not present

## 2019-12-20 DIAGNOSIS — R040 Epistaxis: Secondary | ICD-10-CM | POA: Diagnosis not present

## 2019-12-20 DIAGNOSIS — J343 Hypertrophy of nasal turbinates: Secondary | ICD-10-CM | POA: Diagnosis not present

## 2019-12-21 DIAGNOSIS — D500 Iron deficiency anemia secondary to blood loss (chronic): Secondary | ICD-10-CM | POA: Diagnosis not present

## 2019-12-21 DIAGNOSIS — Z8673 Personal history of transient ischemic attack (TIA), and cerebral infarction without residual deficits: Secondary | ICD-10-CM | POA: Diagnosis not present

## 2019-12-21 DIAGNOSIS — I780 Hereditary hemorrhagic telangiectasia: Secondary | ICD-10-CM | POA: Diagnosis not present

## 2019-12-21 DIAGNOSIS — R002 Palpitations: Secondary | ICD-10-CM | POA: Diagnosis not present

## 2019-12-21 DIAGNOSIS — Z23 Encounter for immunization: Secondary | ICD-10-CM | POA: Diagnosis not present

## 2019-12-24 ENCOUNTER — Ambulatory Visit
Admission: RE | Admit: 2019-12-24 | Discharge: 2019-12-24 | Disposition: A | Discharge status: Home / Self Care | Payer: BC Managed Care – PPO | Source: Ambulatory Visit | Attending: *Deleted | Admitting: *Deleted

## 2019-12-24 DIAGNOSIS — R930 Abnormal findings on diagnostic imaging of skull and head, not elsewhere classified: Secondary | ICD-10-CM | POA: Diagnosis not present

## 2019-12-24 DIAGNOSIS — M279 Disease of jaws, unspecified: Secondary | ICD-10-CM

## 2019-12-24 MED ORDER — IOPAMIDOL (ISOVUE-300) INJECTION 61%
75.0000 mL | Freq: Once | INTRAVENOUS | Status: AC | PRN
  Administered 2019-12-24: 75 mL via INTRAVENOUS

## 2020-01-03 DIAGNOSIS — I780 Hereditary hemorrhagic telangiectasia: Secondary | ICD-10-CM | POA: Diagnosis not present

## 2020-01-03 DIAGNOSIS — K5520 Angiodysplasia of colon without hemorrhage: Secondary | ICD-10-CM | POA: Diagnosis not present

## 2020-01-03 DIAGNOSIS — D72819 Decreased white blood cell count, unspecified: Secondary | ICD-10-CM | POA: Diagnosis not present

## 2020-01-19 DIAGNOSIS — R002 Palpitations: Secondary | ICD-10-CM | POA: Diagnosis not present

## 2020-02-15 ENCOUNTER — Ambulatory Visit (HOSPITAL_COMMUNITY): Payer: BC Managed Care – PPO | Attending: Family Medicine

## 2020-02-15 ENCOUNTER — Encounter (HOSPITAL_COMMUNITY): Payer: Self-pay

## 2020-02-15 ENCOUNTER — Other Ambulatory Visit: Payer: Self-pay

## 2020-02-15 DIAGNOSIS — R293 Abnormal posture: Secondary | ICD-10-CM | POA: Diagnosis present

## 2020-02-15 DIAGNOSIS — M6281 Muscle weakness (generalized): Secondary | ICD-10-CM | POA: Insufficient documentation

## 2020-02-15 DIAGNOSIS — M542 Cervicalgia: Secondary | ICD-10-CM | POA: Diagnosis present

## 2020-02-15 NOTE — Patient Instructions (Signed)
Access Code: [email or web address] URL: [email or web address] Date: 02/15/2020 Prepared by: Sherlyn Lees  Exercises Seated Cervical Retraction - 1 x daily - 7 x weekly Seated Passive Cervical Retraction - 1 x daily - 7 x weekly - 3 sets - 10 reps Corner Pec Major Stretch - 1 x daily - 7 x weekly - 3 sets Doorway Pec Stretch at 90 Degrees Abduction - 1 x daily - 7 x weekly - 3 sets - 3 reps - 30 sec hold

## 2020-02-15 NOTE — Therapy (Signed)
[facility] [address] [place], [place], [address] Phone: [phone]   Fax:  [phone]  Physical Therapy Evaluation  Patient Details  Name: Danielle [name] MRN: [id number] Date of Birth: [birth year] Referring Provider (PT): Quintin Alto, MD   Encounter Date: 02/15/2020   PT End of Session - 02/15/20 0943    Visit Number 1    Number of Visits 8    Date for PT Re-Evaluation 03/14/20    Authorization Type BC[employer] Comm PPO, 30 visit limit combined PT/OT, no auth required    Authorization - Visit Number 1    Authorization - Number of Visits 30    Progress Note Due on Visit 10    PT Start Time 0900    PT Stop Time 0945    PT Time Calculation (min) 45 min           Past Medical History:  Diagnosis Date  . Anemia   . Arthritis   . Brain tumor (HCC)   . Cancer (HCC)   . Hypercholesteremia   . Osler-Weber-Rendu syndrome (HCC)   . Stroke [facility])     Past Surgical History:  Procedure Laterality Date  . ABDOMINAL HYSTERECTOMY    . BRAIN TUMOR EXCISION    . CESAREAN SECTION      There were no vitals filed for this visit.    Subjective Assessment - 02/15/20 0907    Subjective Patient reports hx of neck pain and right arm pain extending down RUE, armpit, to palm of hand.  Patient reports this has been more at night when feeling symptoms and this has been occuring x 6 months to a year with progressive increase in pain. Pt reports using muscle relaxants at night and this has been helpful in reducing pain    Currently in Pain? No/denies    Pain Score 0-No pain              [facility] PT Assessment - 02/15/20 0001      Assessment   Medical Diagnosis cervical pain, radiculopathy    Referring Provider (PT) Quintin Alto, MD      Balance Screen   Has the patient fallen in the past 6 months No      Observation/Other Assessments   Focus on Therapeutic Outcomes (FOTO)  47%      Sensation   Light Touch Appears Intact       Coordination   Fine Motor Movements are Fluid and Coordinated Yes      Posture/Postural Control   Posture/Postural Control Postural limitations    Postural Limitations Rounded Shoulders;Forward head      ROM / Strength   AROM / PROM / Strength AROM;PROM;Strength      AROM   AROM Assessment Site Cervical    Cervical Flexion WNL    Cervical Extension 25 % limited    Cervical - Right Side Bend WNL    Cervical - Left Side Bend painful      Strength   Overall Strength Deficits    Overall Strength Comments demo C5-C7 weakness RUE>LUE   3/5 RUE vs 4/5 LUE     Flexibility   Soft Tissue Assessment /Muscle Length no      Special Tests    Special Tests Cervical    Cervical Tests Spurling's      Spurling's   Findings Positive    Side Right  Objective measurements completed on examination: See above findings.       [facility] Adult PT Treatment/Exercise - 02/15/20 0001      Exercises   Exercises Neck;Shoulder      Neck Exercises: Seated   Neck Retraction 10 reps      Shoulder Exercises: Stretch   Corner Stretch 3 reps;30 seconds                  PT Education - 02/15/20 0942    Education Details pt education in exam findings and anatomy of c-spine    Person(s) Educated Patient    Methods Explanation    Comprehension Verbalized understanding;Returned demonstration            PT Short Term Goals - 02/15/20 0959      PT SHORT TERM GOAL #1   Title Patient will report at least 25% improvement in symptoms for improved quality of life.    Time 2    Period Weeks    Status New    Target Date 02/29/20      PT SHORT TERM GOAL #2   Title Patient will be independent with HEP in order to improve functional outcomes.    Time 2    Period Weeks    Status New    Target Date 02/29/20      PT SHORT TERM GOAL #3   Title Patient will report centralization of RUE pain  with episodes not extending past upper trapezius to improve RUE  strength/activity tolerance    Baseline pain along C5-C7 dermatomes and 3/5 RUE strength    Time 2    Period Weeks    Status New    Target Date 02/29/20             PT Long Term Goals - 02/15/20 1002      PT LONG TERM GOAL #1   Title Patient will improve on FOTO score to meet predicted outcomes to improve functional independence    Baseline 47% function    Time 4    Period Weeks    Status New    Target Date 03/14/20      PT LONG TERM GOAL #2   Title Patient will demonstrate RUE strength 5/5 to improve functional activity tolerance    Baseline 3/5 with pain    Time 4    Period Weeks    Status New    Target Date 03/14/20                  Plan - 02/15/20 0951    Clinical Impression Statement Patient presents with chronic neck pain with referred symptoms experienced in RUE most prominently along C5-C7 dermatomes as outlined by patient report.  Exhibits positive signs/symptoms with provactive maneuver (Spurling's) and exhibits postural deficits and limitations affecting her comfort and quality of life. RUE weakness also manifest during resisted tests which patient reports limits her activity tolerance at home and reports difficulty with day to day tasks in her home and exhibits limitations per FOTO score.  Patient would benefit from skilled PT services to train/instruct in postural re-education and decrease pain/dysfunction to and improve quality of life.    Personal Factors and Comorbidities Comorbidity 1;Time since onset of injury/illness/exacerbation    Comorbidities PMH    Examination-Activity Limitations Carry;Lift;Sleep    Examination-Participation Restrictions Cleaning;Meal Prep;Yard Work    Stability/Clinical Decision Making Stable/Uncomplicated    Clinical Decision Making Low    Rehab Potential Good    PT Frequency  2x / week    PT Duration 4 weeks    PT Treatment/Interventions ADLs/Self Care Home Management;Aquatic Therapy;Cryotherapy;Electrical  Stimulation;Ultrasound;Traction;Moist Heat;Iontophoresis 4mg /ml Dexamethasone;Gait training;Functional mobility training;Therapeutic activities;Therapeutic exercise;Balance training;Patient/family education;Neuromuscular re-education;Manual techniques;Passive range of motion;Vestibular;Taping;Dry needling;Spinal Manipulations;Joint Manipulations    PT Next Visit Plan Continue with chin retraction variations, supine "head lift off" for deep cervical flexors, scapular strengthening    PT Home Exercise Plan chin retraction, corner/door stretch, sitting/supine with towel roll for cervical lordosis           Patient will benefit from skilled therapeutic intervention in order to improve the following deficits and impairments:  Decreased activity tolerance,Decreased strength,Decreased range of motion,Increased muscle spasms,Impaired perceived functional ability,Postural dysfunction,Improper body mechanics,Impaired UE functional use,Pain  Visit Diagnosis: Neck pain  Muscle weakness (generalized)  Abnormal posture     Problem List There are no problems to display for this patient.   10:07 AM, 02/15/20 M. Shary Decamp, PT, DPT Physical Therapist- [place] Office Number: [phone]  [facility] [address] [place], [place], [address] Phone: [phone]   Fax:  [phone]  Name: Danielle [name] MRN: [id number] Date of Birth: [birth year]

## 2020-02-18 ENCOUNTER — Ambulatory Visit (HOSPITAL_COMMUNITY): Payer: BC Managed Care – PPO | Admitting: Physical Therapy

## 2020-02-21 ENCOUNTER — Ambulatory Visit (HOSPITAL_COMMUNITY): Payer: BC Managed Care – PPO | Admitting: Physical Therapy

## 2020-02-21 ENCOUNTER — Other Ambulatory Visit: Payer: Self-pay

## 2020-02-21 ENCOUNTER — Encounter (HOSPITAL_COMMUNITY): Payer: Self-pay | Admitting: Physical Therapy

## 2020-02-21 DIAGNOSIS — M542 Cervicalgia: Secondary | ICD-10-CM

## 2020-02-21 DIAGNOSIS — M6281 Muscle weakness (generalized): Secondary | ICD-10-CM

## 2020-02-21 DIAGNOSIS — R293 Abnormal posture: Secondary | ICD-10-CM

## 2020-02-21 NOTE — Patient Instructions (Signed)
Flexibility: Neck Retraction    Pull head straight back, keeping eyes and jaw level.  Repeat _10_ times per set. Do __2__ sessions per day.   Flexibility: Corner Stretch    Standing in corner with hands just above shoulder level, lean forward until a comfortable stretch is felt across chest. Hold _30_ seconds. Repeat __3__ times per set. Do _2___ sessions per day.    Copyright  VHI. All rights reserved.

## 2020-02-21 NOTE — Therapy (Signed)
[facility] [address] [place], [place], [address] Phone: [phone]   Fax:  [phone]  Physical Therapy Treatment  Patient Details  Name: Danielle [name] MRN: [id number] Date of Birth: 1960-04-07 Referring Provider (PT): Quintin Alto, MD   Encounter Date: 02/21/2020   PT End of Session - 02/21/20 0912    Visit Number 2    Number of Visits 8    Date for PT Re-Evaluation 03/14/20    Authorization Type [employer] Comm PPO, 30 visit limit combined PT/OT, no auth required    Authorization - Visit Number 2    Authorization - Number of Visits 30    Progress Note Due on Visit 10    PT Start Time [phone]    PT Stop Time 0915    PT Time Calculation (min) 41 min           Past Medical History:  Diagnosis Date  . Anemia   . Arthritis   . Brain tumor (HCC)   . Cancer (HCC)   . Hypercholesteremia   . Osler-Weber-Rendu syndrome (HCC)   . Stroke [facility])     Past Surgical History:  Procedure Laterality Date  . ABDOMINAL HYSTERECTOMY    . BRAIN TUMOR EXCISION    . CESAREAN SECTION      There were no vitals filed for this visit.   Subjective Assessment - 02/21/20 0839    Subjective pt states she is much better since she began Flexoril.  States she currently is not having any of the shooting pain into her Rt UE.  No pain at present.    Currently in Pain? No/denies                             [facility] Adult PT Treatment/Exercise - 02/21/20 0001      Neck Exercises: Seated   Neck Retraction 10 reps    W Back 10 reps    Other Seated Exercise cervical excursions 10X    Other Seated Exercise thoracic excursions with UE movements 5X each UE      Shoulder Exercises: Stretch   Corner Stretch 3 reps;30 seconds                  PT Education - 02/21/20 0903    Education Details goal and HEP review.  Given additional copy of HEP as she has misplaced hers    Person(s) Educated Patient    Methods  Explanation;Demonstration;Tactile cues;Verbal cues;Handout    Comprehension Verbalized understanding;Returned demonstration;Verbal cues required;Tactile cues required            PT Short Term Goals - 02/21/20 0909      PT SHORT TERM GOAL #1   Title Patient will report at least 25% improvement in symptoms for improved quality of life.    Time 2    Period Weeks    Status On-going    Target Date 02/29/20      PT SHORT TERM GOAL #2   Title Patient will be independent with HEP in order to improve functional outcomes.    Time 2    Period Weeks    Status On-going    Target Date 02/29/20      PT SHORT TERM GOAL #3   Title Patient will report centralization of RUE pain  with episodes not extending past upper trapezius to improve RUE strength/activity tolerance    Baseline pain along C5-C7 dermatomes and 3/5  RUE strength    Time 2    Period Weeks    Status On-going    Target Date 02/29/20             PT Long Term Goals - 02/21/20 0910      PT LONG TERM GOAL #1   Title Patient will improve on FOTO score to meet predicted outcomes to improve functional independence    Baseline 47% function    Time 4    Period Weeks    Status On-going      PT LONG TERM GOAL #2   Title Patient will demonstrate RUE strength 5/5 to improve functional activity tolerance    Baseline 3/5 with pain    Time 4    Period Weeks    Status On-going                 Plan - 02/21/20 1610    Clinical Impression Statement Reviewed goals and POC moving forward.  Pt misplaced given HEP at eval so given new sheet as well as the cervical excursion exercise.  Pt able to complete ROM in painfree range with only slight discomfort into end range extension.  continued on with thoracic excursion without pain and thoracic retraction.  General cues needed for form but otherwise demonstrated good mobility and technique.    Personal Factors and Comorbidities Comorbidity 1;Time since onset of  injury/illness/exacerbation    Comorbidities PMH    Examination-Activity Limitations Carry;Lift;Sleep    Examination-Participation Restrictions Cleaning;Meal Prep;Yard Work    Stability/Clinical Decision Making Stable/Uncomplicated    Rehab Potential Good    PT Frequency 2x / week    PT Duration 4 weeks    PT Treatment/Interventions ADLs/Self Care Home Management;Aquatic Therapy;Cryotherapy;Electrical Stimulation;Ultrasound;Traction;Moist Heat;Iontophoresis 4mg /ml Dexamethasone;Gait training;Functional mobility training;Therapeutic activities;Therapeutic exercise;Balance training;Patient/family education;Neuromuscular re-education;Manual techniques;Passive range of motion;Vestibular;Taping;Dry needling;Spinal Manipulations;Joint Manipulations    PT Next Visit Plan Continue with chin retraction variations, supine "head lift off" for deep cervical flexors, scapular strengthening.  Begin Rt UE strengthening as well.    PT Home Exercise Plan chin retraction, corner/door stretch, sitting/supine with towel roll for cervical lordosis.  1/20: cervical excursions           Patient will benefit from skilled therapeutic intervention in order to improve the following deficits and impairments:  Decreased activity tolerance,Decreased strength,Decreased range of motion,Increased muscle spasms,Impaired perceived functional ability,Postural dysfunction,Improper body mechanics,Impaired UE functional use,Pain  Visit Diagnosis: Neck pain  Muscle weakness (generalized)  Abnormal posture     Problem List There are no problems to display for this patient.  Lurena Nida, PTA/CLT [phone]   Lurena Nida 02/21/2020, 9:30 AM  [facility] [address] [place], [place], [address] Phone: [phone]   Fax:  [phone]  Name: Danielle [name] MRN: [id number] Date of Birth: 29-Mar-1960

## 2020-02-25 ENCOUNTER — Ambulatory Visit (HOSPITAL_COMMUNITY): Payer: BC Managed Care – PPO | Admitting: Physical Therapy

## 2020-02-25 ENCOUNTER — Other Ambulatory Visit: Payer: Self-pay

## 2020-02-25 DIAGNOSIS — M542 Cervicalgia: Secondary | ICD-10-CM | POA: Diagnosis not present

## 2020-02-25 DIAGNOSIS — M6281 Muscle weakness (generalized): Secondary | ICD-10-CM

## 2020-02-25 DIAGNOSIS — R293 Abnormal posture: Secondary | ICD-10-CM

## 2020-02-25 NOTE — Therapy (Signed)
Status On-going    Target Date 02/29/20      PT SHORT TERM GOAL #3   Title Patient will report centralization of RUE pain  with episodes not extending past upper trapezius to improve RUE strength/activity tolerance    Baseline pain along C5-C7 dermatomes and 3/5 RUE strength    Time 2    Period Weeks    Status On-going    Target Date 02/29/20             PT Long Term Goals - 02/21/20 0910      PT LONG TERM GOAL #1   Title Patient will improve on FOTO score to meet predicted outcomes to improve functional independence    Baseline 47% function    Time 4    Period Weeks    Status On-going      PT LONG TERM GOAL #2   Title Patient will demonstrate RUE strength 5/5 to improve functional activity tolerance    Baseline 3/5 with pain    Time 4    Period Weeks    Status On-going                 Plan - 02/25/20 1041    Clinical Impression Statement Continued with mobility for cervical and thoracic spine.  Began UE strengthening and ROM exericses as well today.  Pt requested these be added to her HEP so given written instructions for home.    Cues needed  to increase hold times and form general form but overall little needed to complete correctly.   Manual added at EOS today with small spasm in Lt UT and large spasm Rt UT.  Both resolved with manual techniques.  Trigger point in Rt eliciting numbness into Rt hand. Pt much improved at EOS with improved and painfree cervical ROM.    Personal Factors and Comorbidities Comorbidity 1;Time since onset of injury/illness/exacerbation    Comorbidities PMH    Examination-Activity Limitations Carry;Lift;Sleep    Examination-Participation Restrictions Cleaning;Meal Prep;Yard Work    Stability/Clinical Decision Making Stable/Uncomplicated    Rehab Potential Good    PT Frequency 2x / week    PT Duration 4 weeks    PT Treatment/Interventions ADLs/Self Care Home Management;Aquatic Therapy;Cryotherapy;Electrical Stimulation;Ultrasound;Traction;Moist Heat;Iontophoresis 4mg /ml Dexamethasone;Gait training;Functional mobility training;Therapeutic activities;Therapeutic exercise;Balance training;Patient/family education;Neuromuscular re-education;Manual techniques;Passive range of motion;Vestibular;Taping;Dry needling;Spinal Manipulations;Joint Manipulations    PT Next Visit Plan continue with improving mobility,  postural and Rt UE strengthening.  Continue with manual to help resolve spasms.    PT Home Exercise Plan chin retraction, corner/door stretch, sitting/supine with towel roll for cervical lordosis.  1/20: cervical excursions 1/24: thoracic excursions, UE flexion at wall, wall arch, Rt UEcurls and rotation.           Patient will benefit from skilled therapeutic intervention in order to improve the following deficits and impairments:  Decreased activity tolerance,Decreased strength,Decreased range of motion,Increased muscle spasms,Impaired perceived functional ability,Postural dysfunction,Improper body mechanics,Impaired UE functional use,Pain  Visit Diagnosis: Muscle weakness (generalized)  Neck  pain  Abnormal posture     Problem List There are no problems to display for this patient.  Teena Irani, PTA/CLT [phone]  Teena Irani 02/25/2020, 10:43 AM  [place], [place], [address] Phone: [phone]   Fax:  [phone]  Name: ARLENE BRICKEL MRN: [id number] Date of Birth: [birth year]  [place], [place], [address] Phone: [phone]   Fax:  [phone]  Physical Therapy Treatment  Patient Details  Name: ELYSIA GRAND MRN: [id number] Date of Birth: 1960-08-22 Referring Provider (PT): Judd Lien, MD   Encounter Date: 02/25/2020   PT End of Session - 02/25/20 1020    Visit Number 3    Number of Visits 8    Date for PT Re-Evaluation 03/14/20    Authorization Type [employer] Comm PPO, 30 visit limit combined PT/OT, no auth required    Authorization - Visit Number 3    Authorization - Number of Visits 30    Progress Note Due on Visit 10    PT Start Time [phone]    PT Stop Time 1000    PT Time Calculation (min) 42 min           Past Medical History:  Diagnosis Date  . Anemia   . Arthritis   . Brain tumor ([place])   . Cancer ([place])   . Hypercholesteremia   . 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 Stroke [facility])     Past Surgical History:  Procedure Laterality Date  . ABDOMINAL HYSTERECTOMY    . BRAIN TUMOR EXCISION    . CESAREAN SECTION      There were no vitals filed for this visit.   Subjective Assessment - 02/25/20 0938    Subjective Still having tingling but no pain down into her Rt UE.  States she can feel the mm working/tightening.    Currently in Pain? No/denies                             [facility] Adult PT Treatment/Exercise - 02/25/20 0001      Neck Exercises: Standing   Other Standing Exercises UE flexion against wall 10X    Other Standing Exercises wall arch 10X      Neck Exercises: Seated   Neck Retraction 10 reps    W Back 10 reps    Other Seated Exercise bicep curls 4# 10X2, supination/pronation 2X10    Other Seated Exercise thoracic excursions with UE movements 5X each UE      Shoulder Exercises: Stretch   Corner Stretch 3 reps;30 seconds      Manual Therapy   Manual Therapy Soft tissue mobilization    Manual therapy comments completed seated to  bilateral UT    Soft tissue mobilization to bil UT                  PT Education - 02/25/20 1014    Education Details updated HEP to include standing UE flexion, wall arch, seated Rt UE strengthening and thoracic excursions    Person(s) Educated Patient    Methods Explanation;Demonstration;Tactile cues;Verbal cues;Handout    Comprehension Verbalized understanding;Returned demonstration;Verbal cues required;Tactile cues required            PT Short Term Goals - 02/21/20 0909      PT SHORT TERM GOAL #1   Title Patient will report at least 25% improvement in symptoms for improved quality of life.    Time 2    Period Weeks    Status On-going    Target Date 02/29/20      PT SHORT TERM GOAL #2   Title Patient will be independent with HEP in order to improve functional outcomes.    Time 2    Period Weeks

## 2020-02-27 ENCOUNTER — Encounter (HOSPITAL_COMMUNITY): Payer: Self-pay | Admitting: Physical Therapy

## 2020-02-27 ENCOUNTER — Other Ambulatory Visit: Payer: Self-pay

## 2020-02-27 ENCOUNTER — Ambulatory Visit (HOSPITAL_COMMUNITY): Payer: BC Managed Care – PPO | Admitting: Physical Therapy

## 2020-02-27 DIAGNOSIS — M542 Cervicalgia: Secondary | ICD-10-CM

## 2020-02-27 DIAGNOSIS — M6281 Muscle weakness (generalized): Secondary | ICD-10-CM

## 2020-02-27 DIAGNOSIS — R293 Abnormal posture: Secondary | ICD-10-CM

## 2020-02-27 NOTE — Therapy (Signed)
Fax:  [phone]  Name: Danielle [name] MRN: [id number] Date of Birth: Sep 17, 1960  [place], [place], [address] Phone: [phone]   Fax:  [phone]  Physical Therapy Treatment  Patient Details  Name: Danielle [name] MRN: [id number] Date of Birth: [birth year] Referring Provider (PT): Judd Lien, MD   Encounter Date: 02/27/2020   PT End of Session - 02/27/20 1312    Visit Number 4    Number of Visits 8    Date for PT Re-Evaluation 03/14/20    Authorization Type [employer] Comm PPO, 30 visit limit combined PT/OT, no auth required    Authorization - Visit Number 4    Authorization - Number of Visits 30    Progress Note Due on Visit 10    PT Start Time 1306    PT Stop Time 1345    PT Time Calculation (min) 39 min    Activity Tolerance Patient tolerated treatment well    Behavior During Therapy [facility] for tasks assessed/performed           Past Medical History:  Diagnosis Date  . Anemia   . Arthritis   . Brain tumor ([place])   . Cancer ([place])   . Hypercholesteremia   . 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 Stroke [facility])     Past Surgical History:  Procedure Laterality Date  . ABDOMINAL HYSTERECTOMY    . BRAIN TUMOR EXCISION    . CESAREAN SECTION      There were no vitals filed for this visit.   Subjective Assessment - 02/27/20 1311    Subjective Patient says she was doing pretty good until last night. She says her arms started tingling at night. She says her thumb went numb when she was doing her hair this morning. Says exercises are making her neck sore, but denies pain currently.    Currently in Pain? No/denies                             [facility] Adult PT Treatment/Exercise - 02/27/20 0001      Neck Exercises: Seated   Neck Retraction 10 reps    Other Seated Exercise thoracic excursions and rotation excursions x 10 each      Shoulder Exercises: Standing   ABduction Both;20 reps;Theraband    Theraband Level (Shoulder ABduction) Level 2 (Red)    Extension Both;20  reps;Theraband    Theraband Level (Shoulder Extension) Level 2 (Red)    Row Both;20 reps;Theraband    Theraband Level (Shoulder Row) Level 2 (Red)      Manual Therapy   Manual Therapy Joint mobilization    Manual therapy comments completed separate from all other activity    Joint Mobilization PA mobs to thoracic spine, patient in prone grade II-III      Neck Exercises: Stretches   Upper Trapezius Stretch Right;Left;2 reps;30 seconds                    PT Short Term Goals - 02/21/20 0909      PT SHORT TERM GOAL #1   Title Patient will report at least 25% improvement in symptoms for improved quality of life.    Time 2    Period Weeks    Status On-going    Target Date 02/29/20      PT SHORT TERM GOAL #2   Title Patient will be independent with HEP in order to improve functional outcomes.    Time 2    Period Weeks  Fax:  [phone]  Name: Danielle [name] MRN: [id number] Date of Birth: Sep 17, 1960

## 2020-02-27 NOTE — Patient Instructions (Signed)
Access Code: [email or web address] URL: [email or web address] Date: 02/27/2020 Prepared by: Josue Hector  Exercises Standing Shoulder Row with Anchored Resistance - 1-2 x daily - 7 x weekly - 2 sets - 10 reps Shoulder Extension with Resistance - 1-2 x daily - 7 x weekly - 2 sets - 10 reps Standing Shoulder Horizontal Abduction with Resistance - 1-2 x daily - 7 x weekly - 2 sets - 10 reps

## 2020-02-28 ENCOUNTER — Encounter (HOSPITAL_COMMUNITY): Payer: BC Managed Care – PPO | Admitting: Physical Therapy

## 2020-03-04 ENCOUNTER — Encounter (HOSPITAL_COMMUNITY): Payer: Self-pay

## 2020-03-04 ENCOUNTER — Ambulatory Visit (HOSPITAL_COMMUNITY): Payer: BC Managed Care – PPO | Attending: Family Medicine

## 2020-03-04 ENCOUNTER — Other Ambulatory Visit: Payer: Self-pay

## 2020-03-04 DIAGNOSIS — R293 Abnormal posture: Secondary | ICD-10-CM | POA: Diagnosis present

## 2020-03-04 DIAGNOSIS — M542 Cervicalgia: Secondary | ICD-10-CM | POA: Diagnosis present

## 2020-03-04 DIAGNOSIS — M6281 Muscle weakness (generalized): Secondary | ICD-10-CM | POA: Diagnosis not present

## 2020-03-04 NOTE — Therapy (Signed)
[place], [place], [address] Phone: [phone]   Fax:  [phone]  Physical Therapy Treatment  Patient Details  Name: Danielle [name] MRN: [id number] Date of Birth: 16-May-1960 Referring Provider (PT): Judd Lien, MD   Encounter Date: 03/04/2020   PT End of Session - 03/04/20 0905    Visit Number 5    Number of Visits 8    Date for PT Re-Evaluation 03/14/20    Authorization Type [employer] Comm PPO, 30 visit limit combined PT/OT, no auth required    Authorization - Visit Number 5    Authorization - Number of Visits 30    Progress Note Due on Visit 10    PT Start Time 0902    PT Stop Time 0950    PT Time Calculation (min) 48 min    Activity Tolerance Patient tolerated treatment well    Behavior During Therapy [facility] for tasks assessed/performed           Past Medical History:  Diagnosis Date  . Anemia   . Arthritis   . Brain tumor ([place])   . Cancer ([facility] [place])   . Hypercholesteremia   . 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 Stroke [facility])     Past Surgical History:  Procedure Laterality Date  . ABDOMINAL HYSTERECTOMY    . BRAIN TUMOR EXCISION    . CESAREAN SECTION      There were no vitals filed for this visit.   Subjective Assessment - 03/04/20 0906    Subjective Patient reports numbness/tingling at night and when performing overhead or static tasks (e.g. holding phone, dressing her hair, etc).  Reports her Flexeril rx has run out and her neck has been more tender/tense as a result              [facility] PT Assessment - 03/04/20 0001      Assessment   Medical Diagnosis cervical pain, radiculopathy    Referring Provider (PT) Judd Lien, MD                         [facility] Adult PT Treatment/Exercise - 03/04/20 0001      Neck Exercises: Standing   Neck Retraction 10 reps;3 secs   against red t0band resistance     Shoulder Exercises: Standing   Horizontal ABduction  Strengthening;Both;20 reps;Theraband    Theraband Level (Shoulder Horizontal ABduction) Level 2 (Red)    Extension Strengthening;Both;20 reps;Theraband    Theraband Level (Shoulder Extension) Level 2 (Red)    Row Strengthening;Both;20 reps;Theraband    Theraband Level (Shoulder Row) Level 2 (Red)    Other Standing Exercises lat row w/ red t-band 3x10      Manual Therapy   Manual Therapy Soft tissue mobilization    Manual therapy comments completed separate from all other activity    Soft tissue mobilization soft tissue mobilization bilateral scalenes and upper trapezius and stretch for shoulder depression to decrease tenderness/spasm to lateral cervical column x 15 min                  PT Education - 03/04/20 0955    Education Details update to HEP and use of resistance to improve neck/shoulder girdle strength to improve stability and decrease pain    Person(s) Educated Patient    Methods Explanation    Comprehension Verbalized understanding;Returned demonstration            PT Short Term Goals - 02/21/20 [phone]  [place], [place], [address] Phone: [phone]   Fax:  [phone]  Physical Therapy Treatment  Patient Details  Name: Danielle [name] MRN: [id number] Date of Birth: 16-May-1960 Referring Provider (PT): Judd Lien, MD   Encounter Date: 03/04/2020   PT End of Session - 03/04/20 0905    Visit Number 5    Number of Visits 8    Date for PT Re-Evaluation 03/14/20    Authorization Type [employer] Comm PPO, 30 visit limit combined PT/OT, no auth required    Authorization - Visit Number 5    Authorization - Number of Visits 30    Progress Note Due on Visit 10    PT Start Time 0902    PT Stop Time 0950    PT Time Calculation (min) 48 min    Activity Tolerance Patient tolerated treatment well    Behavior During Therapy [facility] for tasks assessed/performed           Past Medical History:  Diagnosis Date  . Anemia   . Arthritis   . Brain tumor ([place])   . Cancer ([facility])   . Hypercholesteremia   . 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 Stroke [facility])     Past Surgical History:  Procedure Laterality Date  . ABDOMINAL HYSTERECTOMY    . BRAIN TUMOR EXCISION    . CESAREAN SECTION      There were no vitals filed for this visit.   Subjective Assessment - 03/04/20 0906    Subjective Patient reports numbness/tingling at night and when performing overhead or static tasks (e.g. holding phone, dressing her hair, etc).  Reports her Flexeril rx has run out and her neck has been more tender/tense as a result              [facility] PT Assessment - 03/04/20 0001      Assessment   Medical Diagnosis cervical pain, radiculopathy    Referring Provider (PT) Judd Lien, MD                         [facility] Adult PT Treatment/Exercise - 03/04/20 0001      Neck Exercises: Standing   Neck Retraction 10 reps;3 secs   against red t0band resistance     Shoulder Exercises: Standing   Horizontal ABduction  Strengthening;Both;20 reps;Theraband    Theraband Level (Shoulder Horizontal ABduction) Level 2 (Red)    Extension Strengthening;Both;20 reps;Theraband    Theraband Level (Shoulder Extension) Level 2 (Red)    Row Strengthening;Both;20 reps;Theraband    Theraband Level (Shoulder Row) Level 2 (Red)    Other Standing Exercises lat row w/ red t-band 3x10      Manual Therapy   Manual Therapy Soft tissue mobilization    Manual therapy comments completed separate from all other activity    Soft tissue mobilization soft tissue mobilization bilateral scalenes and upper trapezius and stretch for shoulder depression to decrease tenderness/spasm to lateral cervical column x 15 min                  PT Education - 03/04/20 0955    Education Details update to HEP and use of resistance to improve neck/shoulder girdle strength to improve stability and decrease pain    Person(s) Educated Patient    Methods Explanation    Comprehension Verbalized understanding;Returned demonstration            PT Short Term Goals - 02/21/20 [phone]  There are no problems to display for this patient.   10:00 AM, 03/04/20 M. Sherlyn Lees, PT, DPT Physical Therapist- [place] Office Number: [phone]  [place] [address] [place], [place], [address] Phone: [phone]   Fax:  [phone]  Name: Danielle [name] MRN: [id number] Date of Birth: 08-Apr-1960

## 2020-03-06 ENCOUNTER — Ambulatory Visit (HOSPITAL_COMMUNITY): Payer: BC Managed Care – PPO | Admitting: Physical Therapy

## 2020-03-06 ENCOUNTER — Other Ambulatory Visit: Payer: Self-pay

## 2020-03-06 DIAGNOSIS — M6281 Muscle weakness (generalized): Secondary | ICD-10-CM

## 2020-03-06 DIAGNOSIS — R293 Abnormal posture: Secondary | ICD-10-CM

## 2020-03-06 DIAGNOSIS — M542 Cervicalgia: Secondary | ICD-10-CM

## 2020-03-06 NOTE — Therapy (Signed)
[place], [place], [address] Phone: [phone]   Fax:  [phone]  Physical Therapy Treatment  Patient Details  Name: Danielle [name] MRN: [id number] Date of Birth: September 01, 1960 Referring Provider (PT): Judd Lien, MD   Encounter Date: 03/06/2020   PT End of Session - 03/06/20 1424    Visit Number 6    Number of Visits 8    Date for PT Re-Evaluation 03/14/20    Authorization Type [employer] Comm PPO, 30 visit limit combined PT/OT, no auth required    Authorization - Visit Number 6    Authorization - Number of Visits 30    Progress Note Due on Visit 10    PT Start Time 1321    PT Stop Time 1355    PT Time Calculation (min) 34 min    Activity Tolerance Patient tolerated treatment well    Behavior During Therapy [facility] for tasks assessed/performed           Past Medical History:  Diagnosis Date  . Anemia   . Arthritis   . Brain tumor ([place])   . Cancer ([place])   . Hypercholesteremia   . 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 Stroke [facility])     Past Surgical History:  Procedure Laterality Date  . ABDOMINAL HYSTERECTOMY    . BRAIN TUMOR EXCISION    . CESAREAN SECTION      There were no vitals filed for this visit.   Subjective Assessment - 03/06/20 1414    Subjective Pt states she had some numbness down her Rt UE and pain is 5/10 at C7; 4/10 otherwise.    Currently in Pain? Yes    Pain Score 5     Pain Location Neck                             [facility] Adult PT Treatment/Exercise - 03/06/20 0001      Neck Exercises: Standing   Other Standing Exercises UE flexion against wall 10X    Other Standing Exercises wall arch 10X      Neck Exercises: Seated   Neck Retraction 10 reps    W Back 10 reps      Shoulder Exercises: Standing   Horizontal ABduction Strengthening;Both;20 reps;Theraband    Theraband Level (Shoulder Horizontal ABduction) Level 2 (Red)    ABduction Both;20 reps;Theraband     Theraband Level (Shoulder ABduction) Level 2 (Red)    Extension Strengthening;Both;20 reps;Theraband    Theraband Level (Shoulder Extension) Level 2 (Red)    Row Strengthening;Both;20 reps;Theraband    Theraband Level (Shoulder Row) Level 2 (Red)      Manual Therapy   Manual Therapy Soft tissue mobilization    Manual therapy comments completed separate from all other activity    Soft tissue mobilization soft tissue mobilization bilateral scalenes and upper trapezius and stretch for shoulder depression to decrease tenderness/spasm to lateral cervical column x 15 min                    PT Short Term Goals - 02/21/20 0909      PT SHORT TERM GOAL #1   Title Patient will report at least 25% improvement in symptoms for improved quality of life.    Time 2    Period Weeks    Status On-going    Target Date 02/29/20      PT SHORT TERM GOAL #2  Title Patient will be independent with HEP in order to improve functional outcomes.    Time 2    Period Weeks    Status On-going    Target Date 02/29/20      PT SHORT TERM GOAL #3   Title Patient will report centralization of RUE pain  with episodes not extending past upper trapezius to improve RUE strength/activity tolerance    Baseline pain along C5-C7 dermatomes and 3/5 RUE strength    Time 2    Period Weeks    Status On-going    Target Date 02/29/20             PT Long Term Goals - 02/21/20 0910      PT LONG TERM GOAL #1   Title Patient will improve on FOTO score to meet predicted outcomes to improve functional independence    Baseline 47% function    Time 4    Period Weeks    Status On-going      PT LONG TERM GOAL #2   Title Patient will demonstrate RUE strength 5/5 to improve functional activity tolerance    Baseline 3/5 with pain    Time 4    Period Weeks    Status On-going                 Plan - 03/06/20 1426    Clinical Impression Statement Continued with postural and strengthening for UE's.  Pt  able to complete all activities without pain or rest breaks.  A little sensitive to manual at C7 area in the beginning but reported overall improvement at end of session.  Pt continues to be compliant with HEP.    Personal Factors and Comorbidities Comorbidity 1;Time since onset of injury/illness/exacerbation    Comorbidities PMH    Examination-Activity Limitations Carry;Lift;Sleep    Examination-Participation Restrictions Cleaning;Meal Prep;Yard Work    Stability/Clinical Decision Making Stable/Uncomplicated    Rehab Potential Good    PT Frequency 2x / week    PT Duration 4 weeks    PT Treatment/Interventions ADLs/Self Care Home Management;Aquatic Therapy;Cryotherapy;Electrical Stimulation;Ultrasound;Traction;Moist Heat;Iontophoresis 4mg /ml Dexamethasone;Gait training;Functional mobility training;Therapeutic activities;Therapeutic exercise;Balance training;Patient/family education;Neuromuscular re-education;Manual techniques;Passive range of motion;Vestibular;Taping;Dry needling;Spinal Manipulations;Joint Manipulations    PT Next Visit Plan continue with improving mobility,  postural and Rt UE strengthening.  Continue with manual to help resolve spasms. Upper Limb Tension Stretches    PT Home Exercise Plan chin retraction, corner/door stretch, sitting/supine with towel roll for cervical lordosis.  1/20: cervical excursions 1/24: thoracic excursions, UE flexion at wall, wall arch, Rt UEcurls and rotation. 1/26 band rows, extensions, abduction. 2/1 lat row, chin retraction against band resist    Consulted and Agree with Plan of Care Patient           Patient will benefit from skilled therapeutic intervention in order to improve the following deficits and impairments:  Decreased activity tolerance,Decreased strength,Decreased range of motion,Increased muscle spasms,Impaired perceived functional ability,Postural dysfunction,Improper body mechanics,Impaired UE functional use,Pain  Visit  Diagnosis: Muscle weakness (generalized)  Neck pain  Abnormal posture     Problem List There are no problems to display for this patient.  Teena Irani, PTA/CLT [phone]  Teena Irani 03/06/2020, 2:28 PM  [place] [address] [place], [place], [address] Phone: [phone]   Fax:  [phone]  Name: Danielle [name] MRN: [id number] Date of Birth: 07-05-1960

## 2020-03-11 ENCOUNTER — Ambulatory Visit (HOSPITAL_COMMUNITY): Payer: BC Managed Care – PPO

## 2020-03-11 ENCOUNTER — Other Ambulatory Visit: Payer: Self-pay

## 2020-03-11 ENCOUNTER — Encounter (HOSPITAL_COMMUNITY): Payer: Self-pay

## 2020-03-11 DIAGNOSIS — M6281 Muscle weakness (generalized): Secondary | ICD-10-CM | POA: Diagnosis not present

## 2020-03-11 DIAGNOSIS — R293 Abnormal posture: Secondary | ICD-10-CM

## 2020-03-11 DIAGNOSIS — M542 Cervicalgia: Secondary | ICD-10-CM

## 2020-03-11 NOTE — Therapy (Signed)
Problem List There are no problems to display for this patient.   11:01 AM, 03/11/20 Danielle [name], PT, DPT Physical Therapist- [place] Office Number: [phone]  [facility] [address] [place], [place], [address] Phone: [phone]   Fax:  [phone]  Name: Danielle [name] MRN: [id number] Date of Birth: 1960/04/03  Problem List There are no problems to display for this patient.   11:01 AM, 03/11/20 Danielle [name], PT, DPT Physical Therapist- [place] Office Number: [phone]  [facility] [address] [place], [place], [address] Phone: [phone]   Fax:  [phone]  Name: Danielle [name] MRN: [id number] Date of Birth: 1960/04/03  [place], [place], [address] Phone: [phone]   Fax:  [phone]  Physical Therapy Treatment  Patient Details  Name: Danielle [name] MRN: [id number] Date of Birth: July 03, 1960 Referring Provider (PT): Judd Lien, MD   Encounter Date: 03/11/2020   PT End of Session - 03/11/20 1032    Visit Number 7    Number of Visits 8    Date for PT Re-Evaluation 03/14/20    Authorization Type [employer] Comm PPO, 30 visit limit combined PT/OT, no auth required    Authorization - Visit Number 7    Authorization - Number of Visits 30    Progress Note Due on Visit 10    PT Start Time 1001    PT Stop Time 1058    PT Time Calculation (min) 57 min    Activity Tolerance Patient tolerated treatment well    Behavior During Therapy [facility] for tasks assessed/performed           Past Medical History:  Diagnosis Date  . Anemia   . Arthritis   . Brain tumor ([place])   . Cancer ([place])   . Hypercholesteremia   . 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 Stroke [facility])     Past Surgical History:  Procedure Laterality Date  . ABDOMINAL HYSTERECTOMY    . BRAIN TUMOR EXCISION    . CESAREAN SECTION      There were no vitals filed for this visit.   Subjective Assessment - 03/11/20 1030    Subjective reports continued symptoms along RUE especially with overhead activities such as grooming hair or side sleeping.  Continued compliance with HEP and using neighbors home gym to perform lat rows and seated rows with 10-20 lbs 4x10 reps                             [facility] Adult PT Treatment/Exercise - 03/11/20 0001      Shoulder Exercises: Standing   Horizontal ABduction Strengthening;Both;20 reps;Theraband    Theraband Level (Shoulder Horizontal ABduction) Level 2 (Red)    ABduction Both;20 reps;Theraband    Theraband Level (Shoulder ABduction) Level 2 (Red)    Extension Strengthening;Both;20 reps;Theraband    Theraband Level  (Shoulder Extension) Level 2 (Red)    Row Strengthening;Both;20 reps;Theraband    Theraband Level (Shoulder Row) Level 2 (Red)    Other Standing Exercises lat row w/ red t-band 3x10    Other Standing Exercises wall slides with shoulder retraction (hand-lift off) 2x10      Shoulder Exercises: Power [employer] 10 reps   single arm low position   Other Power [employer] Exercises lat row 10 lbs 2x10      Manual Therapy   Manual Therapy Joint mobilization    Manual therapy comments completed separate from all other activity    Joint Mobilization manual cervical traction x 5 min followed by traction with patient performing active shoulder depression for nerve glide x 5 min. Sidebend/glide right to improve right sidebending    Soft tissue mobilization subocciptal release with traction                  PT Education - 03/11/20 1031    Education Details education on current HEP and use of at home cervical traction device    Person(s) Educated Patient    Methods Explanation    Comprehension Verbalized understanding

## 2020-03-13 ENCOUNTER — Other Ambulatory Visit: Payer: Self-pay

## 2020-03-13 ENCOUNTER — Ambulatory Visit (HOSPITAL_COMMUNITY): Payer: BC Managed Care – PPO | Admitting: Physical Therapy

## 2020-03-13 DIAGNOSIS — R293 Abnormal posture: Secondary | ICD-10-CM

## 2020-03-13 DIAGNOSIS — M6281 Muscle weakness (generalized): Secondary | ICD-10-CM

## 2020-03-13 DIAGNOSIS — M542 Cervicalgia: Secondary | ICD-10-CM

## 2020-03-13 NOTE — Therapy (Signed)
[facility] [address] [place], [place], [address] Phone: [phone]   Fax:  [phone]  Physical Therapy Treatment  Patient Details  Name: Danielle [name] MRN: [id number] Date of Birth: 25-Nov-1960 Referring Provider (PT): Quintin Alto, MD   Encounter Date: 03/13/2020   PT End of Session - 03/13/20 0955    Visit Number 8    Number of Visits 8    Date for PT Re-Evaluation 03/14/20    Authorization Type [employer] Comm PPO, 30 visit limit combined PT/OT, no auth required    Authorization - Visit Number 8    Authorization - Number of Visits 30    PT Start Time [phone]    PT Stop Time 0950    PT Time Calculation (min) 33 min           Past Medical History:  Diagnosis Date  . Anemia   . Arthritis   . Brain tumor ([facility])   . Cancer ([facility])   . Hypercholesteremia   . Osler-Weber-Rendu syndrome ([facility])   . Stroke [facility])     Past Surgical History:  Procedure Laterality Date  . ABDOMINAL HYSTERECTOMY    . BRAIN TUMOR EXCISION    . CESAREAN SECTION      There were no vitals filed for this visit.   Subjective Assessment - 03/13/20 0921    Subjective Pt states the front of her Rt shoulder is sore/painfull today; not sure if it's from the theraband exercises or adding the weight bench exercises.  States she woke with the numbness/tingling in Rt UE last night as well.  States she feels she's a little better but feels she needs the neuro consult.    Currently in Pain? Yes    Pain Score 5     Pain Location Neck    Pain Orientation Lower    Pain Descriptors / Indicators Radiating;Burning    Pain Radiating Towards Rt axillary region to middle finger and thumb              [facility] PT Assessment - 03/13/20 0930      Assessment   Medical Diagnosis cervical pain, radiculopathy    Referring Provider (PT) Quintin Alto, MD      Observation/Other Assessments   Focus on Therapeutic Outcomes (FOTO)  53%   was 47% functional status      Posture/Postural Control   Posture/Postural Control Postural limitations    Postural Limitations Rounded Shoulders;Forward head      AROM   Cervical Flexion WNL    Cervical Extension decreased 10% and painful in endrange (was decreased 25%)    Cervical - Right Side Bend WNL    Cervical - Left Side Bend decreased 10% and painful in endrange      Strength   Overall Strength Deficits    Overall Strength Comments demo C5-C7 weakness but now equal at 4/5   was 3/5 RUE vs 4/5 LUE     Flexibility   Soft Tissue Assessment /Muscle Length no                                 PT Education - 03/13/20 0955    Education Details reviewed HEP and post discharge instructions. Pt with no additional questions or concerns.    Person(s) Educated Patient    Methods Explanation    Comprehension Verbalized understanding  PT Short Term Goals - 03/13/20 0929      PT SHORT TERM GOAL #1   Title Patient will report at least 25% improvement in symptoms for improved quality of life.    Time 2    Period Weeks    Status Achieved    Target Date 02/29/20      PT SHORT TERM GOAL #2   Title Patient will be independent with HEP in order to improve functional outcomes.    Time 2    Period Weeks    Status Achieved    Target Date 02/29/20      PT SHORT TERM GOAL #3   Title Patient will report centralization of RUE pain  with episodes not extending past upper trapezius to improve RUE strength/activity tolerance    Baseline pain along C5-C7 dermatomes and 3/5 RUE strength    Time 2    Period Weeks    Status Not Met    Target Date 02/29/20             PT Long Term Goals - 03/13/20 0930      PT LONG TERM GOAL #1   Title Patient will improve on FOTO score to meet predicted outcomes to improve functional independence    Baseline 47% function    Time 4    Period Weeks    Status Not Met      PT LONG TERM GOAL #2   Title Patient will demonstrate RUE strength 5/5 to  improve functional activity tolerance    Baseline 3/5 with pain    Time 4    Period Weeks    Status On-going                 Plan - 03/13/20 1001    Clinical Impression Statement Pt returns today for re-evaluation.  Pt has made progress overall with improved strength in Rt UE that is now mostly stronger than LT LE, however she has CVA deficits in Lt UE.  Pt has met 2/3 STG's and 1/2 LTG's.  PT still has burning pain with endrange bil rotation and radiculopathy into Rt middle finger and thumb extending from axillary region.  Pt is overall pleased with progress and independent/compliant with HEP.  Pt would like to be discharged at this time and return to MD for possible neurological consult as the radiculopathy persists.    Personal Factors and Comorbidities Comorbidity 1;Time since onset of injury/illness/exacerbation    Comorbidities PMH    Examination-Activity Limitations Carry;Lift;Sleep    Examination-Participation Restrictions Cleaning;Meal Prep;Yard Work    Stability/Clinical Decision Making Stable/Uncomplicated    Rehab Potential Good    PT Frequency 2x / week    PT Duration 4 weeks    PT Treatment/Interventions ADLs/Self Care Home Management;Aquatic Therapy;Cryotherapy;Electrical Stimulation;Ultrasound;Traction;Moist Heat;Iontophoresis 4mg /ml Dexamethasone;Gait training;Functional mobility training;Therapeutic activities;Therapeutic exercise;Balance training;Patient/family education;Neuromuscular re-education;Manual techniques;Passive range of motion;Vestibular;Taping;Dry needling;Spinal Manipulations;Joint Manipulations    PT Next Visit Plan Discharge to HEP.    PT Home Exercise Plan chin retraction, corner/door stretch, sitting/supine with towel roll for cervical lordosis.  1/20: cervical excursions 1/24: thoracic excursions, UE flexion at wall, wall arch, Rt UEcurls and rotation. 1/26 band rows, extensions, abduction. 2/1 lat row, chin retraction against band resist    Consulted  and Agree with Plan of Care Patient           Patient will benefit from skilled therapeutic intervention in order to improve the following deficits and impairments:  Decreased activity tolerance,Decreased strength,Decreased range of motion,Increased muscle  spasms,Impaired perceived functional ability,Postural dysfunction,Improper body mechanics,Impaired UE functional use,Pain  Visit Diagnosis: Abnormal posture  Neck pain  Muscle weakness (generalized)     Problem List There are no problems to display for this patient.  Lurena Nida, PTA/CLT [phone]  Lurena Nida 03/13/2020, 10:06 AM  [facility] [address] [place], [place], [address] Phone: [phone]   Fax:  [phone]  Name: MAYLANA BERTOLUCCI MRN: [id number] Date of Birth: [birth year]

## 2020-04-16 ENCOUNTER — Other Ambulatory Visit: Payer: Self-pay

## 2020-04-16 ENCOUNTER — Encounter (HOSPITAL_COMMUNITY): Payer: Self-pay | Admitting: Physical Therapy

## 2020-04-16 ENCOUNTER — Ambulatory Visit (HOSPITAL_COMMUNITY): Payer: BC Managed Care – PPO | Attending: Family Medicine | Admitting: Physical Therapy

## 2020-04-16 DIAGNOSIS — R293 Abnormal posture: Secondary | ICD-10-CM | POA: Insufficient documentation

## 2020-04-16 DIAGNOSIS — M6281 Muscle weakness (generalized): Secondary | ICD-10-CM | POA: Insufficient documentation

## 2020-04-16 DIAGNOSIS — M542 Cervicalgia: Secondary | ICD-10-CM | POA: Diagnosis present

## 2020-04-16 NOTE — Therapy (Signed)
Title Patient will demonstrate RUE strength 5/5 to improve functional activity tolerance    Baseline 5/5    Time 4    Period Weeks    Status Achieved                 Plan -  04/16/20 1045    Clinical Impression Statement Patient has met 2/3 short term goals and 1/2 long term goals with ability to complete HEP and improvement in symptoms, strength and functional mobility. Patient remains limited by intermittent radicular symptoms which are likely due to position/posture. Patient has reduced pain since beginning therapy in her cervical spine and is showing good ROM and improved UE strength which is equal to LUE. Assessed 1st rib mobility on R which is hypomobile and tender. Patient states increase in RUE symptoms while completing but no change following. Reviewed HEP and educated patient on continuing to perform and returning to therapy in the future if needed. Patient has extensive HEP at this point and is agreeable to continue with HEP to further manage symptoms. Patient discharged from therapy at this time.    Personal Factors and Comorbidities Comorbidity 1;Time since onset of injury/illness/exacerbation    Comorbidities PMH    Examination-Activity Limitations Carry;Lift;Sleep    Examination-Participation Restrictions Cleaning;Meal Prep;Yard Work    Stability/Clinical Decision Making Stable/Uncomplicated    Rehab Potential Good    PT Frequency --    PT Duration --    PT Treatment/Interventions ADLs/Self Care Home Management;Aquatic Therapy;Cryotherapy;Electrical Stimulation;Ultrasound;Traction;Moist Heat;Iontophoresis 75m/ml Dexamethasone;Gait training;Functional mobility training;Therapeutic activities;Therapeutic exercise;Balance training;Patient/family education;Neuromuscular re-education;Manual techniques;Passive range of motion;Vestibular;Taping;Dry needling;Spinal Manipulations;Joint Manipulations    PT Next Visit Plan n/a    PT Home Exercise Plan chin retraction, corner/door stretch, sitting/supine with towel roll for cervical lordosis.  1/20: cervical excursions 1/24: thoracic excursions, UE flexion at wall, wall arch, Rt UEcurls and rotation. 1/26 band rows,  extensions, abduction. 2/1 lat row, chin retraction against band resist    Consulted and Agree with Plan of Care Patient           Patient will benefit from skilled therapeutic intervention in order to improve the following deficits and impairments:  Decreased activity tolerance,Decreased strength,Decreased range of motion,Increased muscle spasms,Impaired perceived functional ability,Postural dysfunction,Improper body mechanics,Impaired UE functional use,Pain  Visit Diagnosis: Abnormal posture  Neck pain  Muscle weakness (generalized)     Problem List There are no problems to display for this patient.   12:18 PM, 04/16/20 AMearl LatinPT, DPT [place] [address]Phone: [phone]  Fax:  [phone] Name: Danielle MCEUENMRN: [id number] of Birth: [birth year]  Title Patient will demonstrate RUE strength 5/5 to improve functional activity tolerance    Baseline 5/5    Time 4    Period Weeks    Status Achieved                 Plan -  04/16/20 1045    Clinical Impression Statement Patient has met 2/3 short term goals and 1/2 long term goals with ability to complete HEP and improvement in symptoms, strength and functional mobility. Patient remains limited by intermittent radicular symptoms which are likely due to position/posture. Patient has reduced pain since beginning therapy in her cervical spine and is showing good ROM and improved UE strength which is equal to LUE. Assessed 1st rib mobility on R which is hypomobile and tender. Patient states increase in RUE symptoms while completing but no change following. Reviewed HEP and educated patient on continuing to perform and returning to therapy in the future if needed. Patient has extensive HEP at this point and is agreeable to continue with HEP to further manage symptoms. Patient discharged from therapy at this time.    Personal Factors and Comorbidities Comorbidity 1;Time since onset of injury/illness/exacerbation    Comorbidities PMH    Examination-Activity Limitations Carry;Lift;Sleep    Examination-Participation Restrictions Cleaning;Meal Prep;Yard Work    Stability/Clinical Decision Making Stable/Uncomplicated    Rehab Potential Good    PT Frequency --    PT Duration --    PT Treatment/Interventions ADLs/Self Care Home Management;Aquatic Therapy;Cryotherapy;Electrical Stimulation;Ultrasound;Traction;Moist Heat;Iontophoresis 75m/ml Dexamethasone;Gait training;Functional mobility training;Therapeutic activities;Therapeutic exercise;Balance training;Patient/family education;Neuromuscular re-education;Manual techniques;Passive range of motion;Vestibular;Taping;Dry needling;Spinal Manipulations;Joint Manipulations    PT Next Visit Plan n/a    PT Home Exercise Plan chin retraction, corner/door stretch, sitting/supine with towel roll for cervical lordosis.  1/20: cervical excursions 1/24: thoracic excursions, UE flexion at wall, wall arch, Rt UEcurls and rotation. 1/26 band rows,  extensions, abduction. 2/1 lat row, chin retraction against band resist    Consulted and Agree with Plan of Care Patient           Patient will benefit from skilled therapeutic intervention in order to improve the following deficits and impairments:  Decreased activity tolerance,Decreased strength,Decreased range of motion,Increased muscle spasms,Impaired perceived functional ability,Postural dysfunction,Improper body mechanics,Impaired UE functional use,Pain  Visit Diagnosis: Abnormal posture  Neck pain  Muscle weakness (generalized)     Problem List There are no problems to display for this patient.   12:18 PM, 04/16/20 AMearl LatinPT, DPT Physical Therapist [place] [address]Phone: [phone]  Fax:  [phone] Name: Danielle MCEUENMRN: [id number] of Birth: [birth year]  [place], [place], [address] Phone: [phone]   Fax:  [phone]  Physical Therapy Treatment/Reevaluation/Discharge Summary  Patient Details  Name: Danielle [name] MRN: [id number] Date of Birth: September 04, 1960 Referring Provider (PT): Judd Lien, MD   Encounter Date: 04/16/2020  PHYSICAL THERAPY DISCHARGE SUMMARY  Visits from Start of Care: 9  Current functional level related to goals / functional outcomes: See below   Remaining deficits: See below   Education / Equipment: See below  Plan: Patient agrees to discharge.  Patient goals were partially met. Patient is being discharged due to meeting the stated rehab goals.  ?????         PT End of Session - 04/16/20 1044    Visit Number 9    Number of Visits 9    Date for PT Re-Evaluation 04/16/20    Authorization Type [employer] Comm PPO, 30 visit limit combined PT/OT, no auth required    Authorization - Visit Number 9    Authorization - Number of Visits 30    PT Start Time 1001    PT Stop Time 1040    PT Time Calculation (min) 39 min    Activity Tolerance Patient tolerated treatment well    Behavior During Therapy WFL for tasks assessed/performed           Past Medical History:  Diagnosis Date  . Anemia   . Arthritis   . Brain tumor ([place])   . Cancer ([place])   . Hypercholesteremia   . 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 Stroke [facility])     Past Surgical History:  Procedure Laterality Date  . ABDOMINAL HYSTERECTOMY    . BRAIN TUMOR EXCISION    . CESAREAN SECTION      There were no vitals filed for this visit.   Subjective Assessment - 04/16/20 1042    Subjective Patient is a 60 y.o. female who presents to physical therapy with c/o cervical radiculopathy. Patient states she had an MRI and stenosis. Her MD does not want to send her to a surgeon yet. The pain has eased but the numbness/tingling is still there. She has been doing all of her  exercises. Patient states pain went from 12/10 pain to 3-4/10 now. Patient states about 50% improvement with PT intervention with decrease in pain but continued intermittent R UE symptoms which is brought on with certain positions.    Currently in Pain? No/denies              [facility] PT Assessment - 04/16/20 0001      Assessment   Medical Diagnosis cervical pain, radiculopathy    Referring Provider (PT) Judd Lien, MD      Precautions   Precautions None      Restrictions   Weight Bearing Restrictions No      Balance Screen   Has the patient fallen in the past 6 months No    Has the patient had a decrease in activity level because of a fear of falling?  No    Is the patient reluctant to leave their home because of a fear of falling?  No      Cognition   Overall Cognitive Status Within Functional Limits for tasks assessed      Sensation   Additional Comments decreased LUE from prior CVA      Posture/Postural Control   Posture/Postural Control Postural limitations    Postural Limitations Rounded Shoulders;Forward head

## 2020-07-03 DIAGNOSIS — D500 Iron deficiency anemia secondary to blood loss (chronic): Secondary | ICD-10-CM | POA: Diagnosis not present

## 2020-07-03 DIAGNOSIS — K5520 Angiodysplasia of colon without hemorrhage: Secondary | ICD-10-CM | POA: Diagnosis not present

## 2020-07-17 DIAGNOSIS — E611 Iron deficiency: Secondary | ICD-10-CM | POA: Diagnosis not present

## 2020-07-17 DIAGNOSIS — I780 Hereditary hemorrhagic telangiectasia: Secondary | ICD-10-CM | POA: Diagnosis not present

## 2020-08-01 DIAGNOSIS — D500 Iron deficiency anemia secondary to blood loss (chronic): Secondary | ICD-10-CM | POA: Diagnosis not present

## 2020-08-08 DIAGNOSIS — D500 Iron deficiency anemia secondary to blood loss (chronic): Secondary | ICD-10-CM | POA: Diagnosis not present

## 2020-08-21 DIAGNOSIS — K219 Gastro-esophageal reflux disease without esophagitis: Secondary | ICD-10-CM | POA: Diagnosis not present

## 2020-08-21 DIAGNOSIS — D500 Iron deficiency anemia secondary to blood loss (chronic): Secondary | ICD-10-CM | POA: Diagnosis not present

## 2020-08-21 DIAGNOSIS — I780 Hereditary hemorrhagic telangiectasia: Secondary | ICD-10-CM | POA: Diagnosis not present

## 2020-08-21 DIAGNOSIS — D72819 Decreased white blood cell count, unspecified: Secondary | ICD-10-CM | POA: Diagnosis not present

## 2020-08-21 DIAGNOSIS — R040 Epistaxis: Secondary | ICD-10-CM | POA: Diagnosis not present

## 2020-08-21 DIAGNOSIS — K5790 Diverticulosis of intestine, part unspecified, without perforation or abscess without bleeding: Secondary | ICD-10-CM | POA: Diagnosis not present

## 2020-08-21 DIAGNOSIS — H9192 Unspecified hearing loss, left ear: Secondary | ICD-10-CM | POA: Diagnosis not present

## 2020-08-21 DIAGNOSIS — E785 Hyperlipidemia, unspecified: Secondary | ICD-10-CM | POA: Diagnosis not present

## 2020-08-21 DIAGNOSIS — D72818 Other decreased white blood cell count: Secondary | ICD-10-CM | POA: Diagnosis not present

## 2020-08-21 DIAGNOSIS — D649 Anemia, unspecified: Secondary | ICD-10-CM | POA: Diagnosis not present

## 2020-08-21 DIAGNOSIS — G43909 Migraine, unspecified, not intractable, without status migrainosus: Secondary | ICD-10-CM | POA: Diagnosis not present

## 2020-08-21 DIAGNOSIS — K5900 Constipation, unspecified: Secondary | ICD-10-CM | POA: Diagnosis not present

## 2020-08-21 DIAGNOSIS — G510 Bell's palsy: Secondary | ICD-10-CM | POA: Diagnosis not present

## 2020-08-27 DIAGNOSIS — L570 Actinic keratosis: Secondary | ICD-10-CM | POA: Diagnosis not present

## 2020-09-08 DIAGNOSIS — R5382 Chronic fatigue, unspecified: Secondary | ICD-10-CM | POA: Diagnosis not present

## 2020-09-08 DIAGNOSIS — E559 Vitamin D deficiency, unspecified: Secondary | ICD-10-CM | POA: Diagnosis not present

## 2020-09-08 DIAGNOSIS — G464 Cerebellar stroke syndrome: Secondary | ICD-10-CM | POA: Diagnosis not present

## 2020-09-08 DIAGNOSIS — E7849 Other hyperlipidemia: Secondary | ICD-10-CM | POA: Diagnosis not present

## 2020-09-08 DIAGNOSIS — K219 Gastro-esophageal reflux disease without esophagitis: Secondary | ICD-10-CM | POA: Diagnosis not present

## 2020-09-08 DIAGNOSIS — E782 Mixed hyperlipidemia: Secondary | ICD-10-CM | POA: Diagnosis not present

## 2020-09-08 DIAGNOSIS — Z1329 Encounter for screening for other suspected endocrine disorder: Secondary | ICD-10-CM | POA: Diagnosis not present

## 2020-09-11 DIAGNOSIS — Z1212 Encounter for screening for malignant neoplasm of rectum: Secondary | ICD-10-CM | POA: Diagnosis not present

## 2020-09-11 DIAGNOSIS — Z1231 Encounter for screening mammogram for malignant neoplasm of breast: Secondary | ICD-10-CM | POA: Diagnosis not present

## 2020-09-11 DIAGNOSIS — Z01419 Encounter for gynecological examination (general) (routine) without abnormal findings: Secondary | ICD-10-CM | POA: Diagnosis not present

## 2020-09-12 DIAGNOSIS — Q8503 Schwannomatosis: Secondary | ICD-10-CM | POA: Diagnosis not present

## 2020-09-12 DIAGNOSIS — Z0001 Encounter for general adult medical examination with abnormal findings: Secondary | ICD-10-CM | POA: Diagnosis not present

## 2020-09-12 DIAGNOSIS — I780 Hereditary hemorrhagic telangiectasia: Secondary | ICD-10-CM | POA: Diagnosis not present

## 2020-09-12 DIAGNOSIS — E7849 Other hyperlipidemia: Secondary | ICD-10-CM | POA: Diagnosis not present

## 2020-09-12 DIAGNOSIS — R0789 Other chest pain: Secondary | ICD-10-CM | POA: Diagnosis not present

## 2020-09-12 DIAGNOSIS — I770 Arteriovenous fistula, acquired: Secondary | ICD-10-CM | POA: Diagnosis not present

## 2020-09-12 DIAGNOSIS — M4802 Spinal stenosis, cervical region: Secondary | ICD-10-CM | POA: Diagnosis not present

## 2020-09-12 DIAGNOSIS — D509 Iron deficiency anemia, unspecified: Secondary | ICD-10-CM | POA: Diagnosis not present

## 2020-09-23 DIAGNOSIS — D508 Other iron deficiency anemias: Secondary | ICD-10-CM | POA: Diagnosis not present

## 2020-09-23 DIAGNOSIS — R0789 Other chest pain: Secondary | ICD-10-CM | POA: Diagnosis not present

## 2020-09-29 DIAGNOSIS — K1379 Other lesions of oral mucosa: Secondary | ICD-10-CM | POA: Diagnosis not present

## 2020-09-29 DIAGNOSIS — Z7722 Contact with and (suspected) exposure to environmental tobacco smoke (acute) (chronic): Secondary | ICD-10-CM | POA: Diagnosis not present

## 2020-09-29 DIAGNOSIS — R930 Abnormal findings on diagnostic imaging of skull and head, not elsewhere classified: Secondary | ICD-10-CM | POA: Diagnosis not present

## 2020-10-07 DIAGNOSIS — R350 Frequency of micturition: Secondary | ICD-10-CM | POA: Diagnosis not present

## 2020-10-07 DIAGNOSIS — R3914 Feeling of incomplete bladder emptying: Secondary | ICD-10-CM | POA: Diagnosis not present

## 2020-10-07 DIAGNOSIS — N3281 Overactive bladder: Secondary | ICD-10-CM | POA: Diagnosis not present

## 2020-10-07 DIAGNOSIS — R3912 Poor urinary stream: Secondary | ICD-10-CM | POA: Diagnosis not present

## 2020-10-07 DIAGNOSIS — N3000 Acute cystitis without hematuria: Secondary | ICD-10-CM | POA: Diagnosis not present

## 2020-10-07 DIAGNOSIS — R3915 Urgency of urination: Secondary | ICD-10-CM | POA: Diagnosis not present

## 2020-10-07 DIAGNOSIS — R109 Unspecified abdominal pain: Secondary | ICD-10-CM | POA: Diagnosis not present

## 2020-10-16 DIAGNOSIS — K5520 Angiodysplasia of colon without hemorrhage: Secondary | ICD-10-CM | POA: Diagnosis not present

## 2020-10-16 DIAGNOSIS — I780 Hereditary hemorrhagic telangiectasia: Secondary | ICD-10-CM | POA: Diagnosis not present

## 2020-10-16 DIAGNOSIS — I781 Nevus, non-neoplastic: Secondary | ICD-10-CM | POA: Diagnosis not present

## 2020-10-16 DIAGNOSIS — D500 Iron deficiency anemia secondary to blood loss (chronic): Secondary | ICD-10-CM | POA: Diagnosis not present

## 2020-10-16 DIAGNOSIS — R059 Cough, unspecified: Secondary | ICD-10-CM | POA: Diagnosis not present

## 2020-10-16 DIAGNOSIS — Z8719 Personal history of other diseases of the digestive system: Secondary | ICD-10-CM | POA: Diagnosis not present

## 2020-10-16 DIAGNOSIS — K219 Gastro-esophageal reflux disease without esophagitis: Secondary | ICD-10-CM | POA: Diagnosis not present

## 2020-10-16 DIAGNOSIS — R0789 Other chest pain: Secondary | ICD-10-CM | POA: Diagnosis not present

## 2020-10-23 DIAGNOSIS — K5903 Drug induced constipation: Secondary | ICD-10-CM | POA: Diagnosis not present

## 2020-10-23 DIAGNOSIS — K922 Gastrointestinal hemorrhage, unspecified: Secondary | ICD-10-CM | POA: Diagnosis not present

## 2020-10-23 DIAGNOSIS — Q2572 Congenital pulmonary arteriovenous malformation: Secondary | ICD-10-CM | POA: Diagnosis not present

## 2020-10-23 DIAGNOSIS — D508 Other iron deficiency anemias: Secondary | ICD-10-CM | POA: Diagnosis not present

## 2020-10-23 DIAGNOSIS — D509 Iron deficiency anemia, unspecified: Secondary | ICD-10-CM | POA: Diagnosis not present

## 2020-10-23 DIAGNOSIS — R42 Dizziness and giddiness: Secondary | ICD-10-CM | POA: Diagnosis not present

## 2020-11-04 DIAGNOSIS — R3912 Poor urinary stream: Secondary | ICD-10-CM | POA: Diagnosis not present

## 2020-11-04 DIAGNOSIS — N3281 Overactive bladder: Secondary | ICD-10-CM | POA: Diagnosis not present

## 2020-11-04 DIAGNOSIS — N3000 Acute cystitis without hematuria: Secondary | ICD-10-CM | POA: Diagnosis not present

## 2020-11-13 DIAGNOSIS — D500 Iron deficiency anemia secondary to blood loss (chronic): Secondary | ICD-10-CM | POA: Diagnosis not present

## 2020-11-20 DIAGNOSIS — D500 Iron deficiency anemia secondary to blood loss (chronic): Secondary | ICD-10-CM | POA: Diagnosis not present

## 2020-12-02 DIAGNOSIS — J343 Hypertrophy of nasal turbinates: Secondary | ICD-10-CM | POA: Diagnosis not present

## 2020-12-02 DIAGNOSIS — Z7722 Contact with and (suspected) exposure to environmental tobacco smoke (acute) (chronic): Secondary | ICD-10-CM | POA: Diagnosis not present

## 2020-12-02 DIAGNOSIS — Z8673 Personal history of transient ischemic attack (TIA), and cerebral infarction without residual deficits: Secondary | ICD-10-CM | POA: Diagnosis not present

## 2020-12-02 DIAGNOSIS — J3489 Other specified disorders of nose and nasal sinuses: Secondary | ICD-10-CM | POA: Diagnosis not present

## 2020-12-02 DIAGNOSIS — Z79899 Other long term (current) drug therapy: Secondary | ICD-10-CM | POA: Diagnosis not present

## 2020-12-02 DIAGNOSIS — I780 Hereditary hemorrhagic telangiectasia: Secondary | ICD-10-CM | POA: Diagnosis not present

## 2020-12-02 DIAGNOSIS — R040 Epistaxis: Secondary | ICD-10-CM | POA: Diagnosis not present

## 2020-12-02 DIAGNOSIS — G518 Other disorders of facial nerve: Secondary | ICD-10-CM | POA: Diagnosis not present

## 2020-12-02 DIAGNOSIS — J342 Deviated nasal septum: Secondary | ICD-10-CM | POA: Diagnosis not present

## 2020-12-02 DIAGNOSIS — Z7982 Long term (current) use of aspirin: Secondary | ICD-10-CM | POA: Diagnosis not present

## 2020-12-02 DIAGNOSIS — Z86018 Personal history of other benign neoplasm: Secondary | ICD-10-CM | POA: Diagnosis not present

## 2020-12-15 DIAGNOSIS — R3912 Poor urinary stream: Secondary | ICD-10-CM | POA: Diagnosis not present

## 2020-12-15 DIAGNOSIS — N3582 Other urethral stricture, female: Secondary | ICD-10-CM | POA: Diagnosis not present

## 2020-12-15 DIAGNOSIS — Z87440 Personal history of urinary (tract) infections: Secondary | ICD-10-CM | POA: Diagnosis not present

## 2020-12-31 DIAGNOSIS — Z79899 Other long term (current) drug therapy: Secondary | ICD-10-CM | POA: Diagnosis not present

## 2020-12-31 DIAGNOSIS — Q2730 Arteriovenous malformation, site unspecified: Secondary | ICD-10-CM | POA: Diagnosis not present

## 2020-12-31 DIAGNOSIS — E782 Mixed hyperlipidemia: Secondary | ICD-10-CM | POA: Diagnosis not present

## 2020-12-31 DIAGNOSIS — R0602 Shortness of breath: Secondary | ICD-10-CM | POA: Diagnosis not present

## 2020-12-31 DIAGNOSIS — D500 Iron deficiency anemia secondary to blood loss (chronic): Secondary | ICD-10-CM | POA: Diagnosis not present

## 2020-12-31 DIAGNOSIS — E785 Hyperlipidemia, unspecified: Secondary | ICD-10-CM | POA: Diagnosis not present

## 2020-12-31 DIAGNOSIS — R030 Elevated blood-pressure reading, without diagnosis of hypertension: Secondary | ICD-10-CM | POA: Diagnosis not present

## 2020-12-31 DIAGNOSIS — I780 Hereditary hemorrhagic telangiectasia: Secondary | ICD-10-CM | POA: Diagnosis not present

## 2020-12-31 DIAGNOSIS — Z9289 Personal history of other medical treatment: Secondary | ICD-10-CM | POA: Diagnosis not present

## 2020-12-31 DIAGNOSIS — R002 Palpitations: Secondary | ICD-10-CM | POA: Diagnosis not present

## 2021-01-07 DIAGNOSIS — R309 Painful micturition, unspecified: Secondary | ICD-10-CM | POA: Diagnosis not present

## 2021-01-07 DIAGNOSIS — R300 Dysuria: Secondary | ICD-10-CM | POA: Diagnosis not present

## 2021-01-15 DIAGNOSIS — D649 Anemia, unspecified: Secondary | ICD-10-CM | POA: Diagnosis not present

## 2021-01-15 DIAGNOSIS — K5520 Angiodysplasia of colon without hemorrhage: Secondary | ICD-10-CM | POA: Diagnosis not present

## 2021-01-15 DIAGNOSIS — I781 Nevus, non-neoplastic: Secondary | ICD-10-CM | POA: Diagnosis not present

## 2021-01-15 DIAGNOSIS — I780 Hereditary hemorrhagic telangiectasia: Secondary | ICD-10-CM | POA: Diagnosis not present

## 2021-01-21 DIAGNOSIS — H02052 Trichiasis without entropian right lower eyelid: Secondary | ICD-10-CM | POA: Diagnosis not present

## 2021-01-22 DIAGNOSIS — D508 Other iron deficiency anemias: Secondary | ICD-10-CM | POA: Diagnosis not present

## 2021-01-22 DIAGNOSIS — D72819 Decreased white blood cell count, unspecified: Secondary | ICD-10-CM | POA: Diagnosis not present

## 2021-01-22 DIAGNOSIS — D509 Iron deficiency anemia, unspecified: Secondary | ICD-10-CM | POA: Diagnosis not present

## 2021-03-13 DIAGNOSIS — D519 Vitamin B12 deficiency anemia, unspecified: Secondary | ICD-10-CM | POA: Diagnosis not present

## 2021-03-13 DIAGNOSIS — D649 Anemia, unspecified: Secondary | ICD-10-CM | POA: Diagnosis not present

## 2021-03-13 DIAGNOSIS — Z1329 Encounter for screening for other suspected endocrine disorder: Secondary | ICD-10-CM | POA: Diagnosis not present

## 2021-03-13 DIAGNOSIS — E7849 Other hyperlipidemia: Secondary | ICD-10-CM | POA: Diagnosis not present

## 2021-03-13 DIAGNOSIS — D529 Folate deficiency anemia, unspecified: Secondary | ICD-10-CM | POA: Diagnosis not present

## 2021-03-13 DIAGNOSIS — E782 Mixed hyperlipidemia: Secondary | ICD-10-CM | POA: Diagnosis not present

## 2021-03-13 DIAGNOSIS — K219 Gastro-esophageal reflux disease without esophagitis: Secondary | ICD-10-CM | POA: Diagnosis not present

## 2021-03-17 DIAGNOSIS — H02052 Trichiasis without entropian right lower eyelid: Secondary | ICD-10-CM | POA: Diagnosis not present

## 2021-03-17 DIAGNOSIS — H02155 Paralytic ectropion of left lower eyelid: Secondary | ICD-10-CM | POA: Diagnosis not present

## 2021-03-17 DIAGNOSIS — Z01818 Encounter for other preprocedural examination: Secondary | ICD-10-CM | POA: Diagnosis not present

## 2021-03-19 DIAGNOSIS — M4802 Spinal stenosis, cervical region: Secondary | ICD-10-CM | POA: Diagnosis not present

## 2021-03-19 DIAGNOSIS — R040 Epistaxis: Secondary | ICD-10-CM | POA: Diagnosis not present

## 2021-03-19 DIAGNOSIS — Q8503 Schwannomatosis: Secondary | ICD-10-CM | POA: Diagnosis not present

## 2021-03-19 DIAGNOSIS — D509 Iron deficiency anemia, unspecified: Secondary | ICD-10-CM | POA: Diagnosis not present

## 2021-03-19 DIAGNOSIS — I780 Hereditary hemorrhagic telangiectasia: Secondary | ICD-10-CM | POA: Diagnosis not present

## 2021-03-19 DIAGNOSIS — E7849 Other hyperlipidemia: Secondary | ICD-10-CM | POA: Diagnosis not present

## 2021-03-19 DIAGNOSIS — M5412 Radiculopathy, cervical region: Secondary | ICD-10-CM | POA: Diagnosis not present

## 2021-03-19 DIAGNOSIS — I770 Arteriovenous fistula, acquired: Secondary | ICD-10-CM | POA: Diagnosis not present

## 2021-03-27 DIAGNOSIS — G43109 Migraine with aura, not intractable, without status migrainosus: Secondary | ICD-10-CM | POA: Diagnosis not present

## 2021-03-27 DIAGNOSIS — D500 Iron deficiency anemia secondary to blood loss (chronic): Secondary | ICD-10-CM | POA: Diagnosis not present

## 2021-03-27 DIAGNOSIS — E782 Mixed hyperlipidemia: Secondary | ICD-10-CM | POA: Diagnosis not present

## 2021-04-03 DIAGNOSIS — D500 Iron deficiency anemia secondary to blood loss (chronic): Secondary | ICD-10-CM | POA: Diagnosis not present

## 2021-04-08 DIAGNOSIS — H02155 Paralytic ectropion of left lower eyelid: Secondary | ICD-10-CM | POA: Diagnosis not present

## 2021-04-10 DIAGNOSIS — D500 Iron deficiency anemia secondary to blood loss (chronic): Secondary | ICD-10-CM | POA: Diagnosis not present

## 2021-04-23 DIAGNOSIS — I780 Hereditary hemorrhagic telangiectasia: Secondary | ICD-10-CM | POA: Diagnosis not present

## 2021-04-23 DIAGNOSIS — D72819 Decreased white blood cell count, unspecified: Secondary | ICD-10-CM | POA: Diagnosis not present

## 2021-04-23 DIAGNOSIS — Z8673 Personal history of transient ischemic attack (TIA), and cerebral infarction without residual deficits: Secondary | ICD-10-CM | POA: Diagnosis not present

## 2021-04-23 DIAGNOSIS — D509 Iron deficiency anemia, unspecified: Secondary | ICD-10-CM | POA: Diagnosis not present

## 2021-04-23 DIAGNOSIS — K921 Melena: Secondary | ICD-10-CM | POA: Diagnosis not present

## 2021-04-23 DIAGNOSIS — R040 Epistaxis: Secondary | ICD-10-CM | POA: Diagnosis not present

## 2021-04-23 DIAGNOSIS — K219 Gastro-esophageal reflux disease without esophagitis: Secondary | ICD-10-CM | POA: Diagnosis not present

## 2021-05-26 DIAGNOSIS — D485 Neoplasm of uncertain behavior of skin: Secondary | ICD-10-CM | POA: Diagnosis not present

## 2021-05-26 DIAGNOSIS — C44111 Basal cell carcinoma of skin of unspecified eyelid, including canthus: Secondary | ICD-10-CM | POA: Diagnosis not present

## 2021-05-28 DIAGNOSIS — Z6827 Body mass index (BMI) 27.0-27.9, adult: Secondary | ICD-10-CM | POA: Diagnosis not present

## 2021-05-28 DIAGNOSIS — I10 Essential (primary) hypertension: Secondary | ICD-10-CM | POA: Diagnosis not present

## 2021-05-28 DIAGNOSIS — H6503 Acute serous otitis media, bilateral: Secondary | ICD-10-CM | POA: Diagnosis not present

## 2021-06-01 DIAGNOSIS — B3789 Other sites of candidiasis: Secondary | ICD-10-CM | POA: Diagnosis not present

## 2021-06-01 DIAGNOSIS — Z6827 Body mass index (BMI) 27.0-27.9, adult: Secondary | ICD-10-CM | POA: Diagnosis not present

## 2021-06-02 DIAGNOSIS — J343 Hypertrophy of nasal turbinates: Secondary | ICD-10-CM | POA: Diagnosis not present

## 2021-06-02 DIAGNOSIS — J342 Deviated nasal septum: Secondary | ICD-10-CM | POA: Diagnosis not present

## 2021-06-02 DIAGNOSIS — R040 Epistaxis: Secondary | ICD-10-CM | POA: Diagnosis not present

## 2021-06-02 DIAGNOSIS — Z885 Allergy status to narcotic agent status: Secondary | ICD-10-CM | POA: Diagnosis not present

## 2021-06-02 DIAGNOSIS — Z882 Allergy status to sulfonamides status: Secondary | ICD-10-CM | POA: Diagnosis not present

## 2021-06-02 DIAGNOSIS — I780 Hereditary hemorrhagic telangiectasia: Secondary | ICD-10-CM | POA: Diagnosis not present

## 2021-06-19 DIAGNOSIS — D22121 Melanocytic nevi of left upper eyelid, including canthus: Secondary | ICD-10-CM | POA: Diagnosis not present

## 2021-06-19 DIAGNOSIS — D485 Neoplasm of uncertain behavior of skin: Secondary | ICD-10-CM | POA: Diagnosis not present

## 2021-07-08 ENCOUNTER — Other Ambulatory Visit: Payer: Self-pay | Admitting: Optometry

## 2021-07-08 ENCOUNTER — Other Ambulatory Visit (HOSPITAL_COMMUNITY): Payer: Self-pay | Admitting: Optometry

## 2021-07-08 DIAGNOSIS — H11422 Conjunctival edema, left eye: Secondary | ICD-10-CM

## 2021-07-15 ENCOUNTER — Ambulatory Visit (HOSPITAL_COMMUNITY)
Admission: RE | Admit: 2021-07-15 | Discharge: 2021-07-15 | Disposition: A | Discharge status: Home / Self Care | Payer: PPO | Source: Ambulatory Visit | Attending: Optometry | Admitting: Optometry

## 2021-07-15 DIAGNOSIS — H11422 Conjunctival edema, left eye: Secondary | ICD-10-CM | POA: Diagnosis not present

## 2021-07-15 DIAGNOSIS — Z85828 Personal history of other malignant neoplasm of skin: Secondary | ICD-10-CM | POA: Diagnosis not present

## 2021-07-15 DIAGNOSIS — G9389 Other specified disorders of brain: Secondary | ICD-10-CM | POA: Diagnosis not present

## 2021-07-15 LAB — POCT I-STAT CREATININE: Creatinine, Ser: 0.9 mg/dL (ref 0.44–1.00)

## 2021-07-15 MED ORDER — IOHEXOL 300 MG/ML  SOLN
75.0000 mL | Freq: Once | INTRAMUSCULAR | Status: AC | PRN
  Administered 2021-07-15: 75 mL via INTRAVENOUS

## 2021-07-15 MED ORDER — SODIUM CHLORIDE (PF) 0.9 % IJ SOLN
INTRAMUSCULAR | Status: AC
  Filled 2021-07-15: qty 50

## 2021-07-20 DIAGNOSIS — K219 Gastro-esophageal reflux disease without esophagitis: Secondary | ICD-10-CM | POA: Diagnosis not present

## 2021-07-20 DIAGNOSIS — D509 Iron deficiency anemia, unspecified: Secondary | ICD-10-CM | POA: Diagnosis not present

## 2021-07-20 DIAGNOSIS — R040 Epistaxis: Secondary | ICD-10-CM | POA: Diagnosis not present

## 2021-07-20 DIAGNOSIS — K921 Melena: Secondary | ICD-10-CM | POA: Diagnosis not present

## 2021-07-20 DIAGNOSIS — Z8673 Personal history of transient ischemic attack (TIA), and cerebral infarction without residual deficits: Secondary | ICD-10-CM | POA: Diagnosis not present

## 2021-07-20 DIAGNOSIS — G510 Bell's palsy: Secondary | ICD-10-CM | POA: Diagnosis not present

## 2021-07-20 DIAGNOSIS — Q2739 Arteriovenous malformation, other site: Secondary | ICD-10-CM | POA: Diagnosis not present

## 2021-07-20 DIAGNOSIS — E785 Hyperlipidemia, unspecified: Secondary | ICD-10-CM | POA: Diagnosis not present

## 2021-07-20 DIAGNOSIS — Z86018 Personal history of other benign neoplasm: Secondary | ICD-10-CM | POA: Diagnosis not present

## 2021-07-20 DIAGNOSIS — K5790 Diverticulosis of intestine, part unspecified, without perforation or abscess without bleeding: Secondary | ICD-10-CM | POA: Diagnosis not present

## 2021-07-20 DIAGNOSIS — H918X2 Other specified hearing loss, left ear: Secondary | ICD-10-CM | POA: Diagnosis not present

## 2021-07-20 DIAGNOSIS — G43909 Migraine, unspecified, not intractable, without status migrainosus: Secondary | ICD-10-CM | POA: Diagnosis not present

## 2021-07-20 DIAGNOSIS — D500 Iron deficiency anemia secondary to blood loss (chronic): Secondary | ICD-10-CM | POA: Diagnosis not present

## 2021-07-20 DIAGNOSIS — I780 Hereditary hemorrhagic telangiectasia: Secondary | ICD-10-CM | POA: Diagnosis not present

## 2021-07-23 DIAGNOSIS — K5520 Angiodysplasia of colon without hemorrhage: Secondary | ICD-10-CM | POA: Diagnosis not present

## 2021-07-23 DIAGNOSIS — I780 Hereditary hemorrhagic telangiectasia: Secondary | ICD-10-CM | POA: Diagnosis not present

## 2021-07-28 DIAGNOSIS — D485 Neoplasm of uncertain behavior of skin: Secondary | ICD-10-CM | POA: Diagnosis not present

## 2021-07-28 DIAGNOSIS — Z01818 Encounter for other preprocedural examination: Secondary | ICD-10-CM | POA: Diagnosis not present

## 2021-07-29 DIAGNOSIS — D500 Iron deficiency anemia secondary to blood loss (chronic): Secondary | ICD-10-CM | POA: Diagnosis not present

## 2021-08-05 DIAGNOSIS — D500 Iron deficiency anemia secondary to blood loss (chronic): Secondary | ICD-10-CM | POA: Diagnosis not present

## 2021-08-12 DIAGNOSIS — C441122 Basal cell carcinoma of skin of right lower eyelid, including canthus: Secondary | ICD-10-CM | POA: Diagnosis not present

## 2021-08-13 DIAGNOSIS — C441122 Basal cell carcinoma of skin of right lower eyelid, including canthus: Secondary | ICD-10-CM | POA: Diagnosis not present

## 2021-08-13 DIAGNOSIS — L720 Epidermal cyst: Secondary | ICD-10-CM | POA: Diagnosis not present

## 2021-08-13 DIAGNOSIS — H029 Unspecified disorder of eyelid: Secondary | ICD-10-CM | POA: Diagnosis not present

## 2021-08-26 DIAGNOSIS — L570 Actinic keratosis: Secondary | ICD-10-CM | POA: Diagnosis not present

## 2021-08-31 DIAGNOSIS — G43909 Migraine, unspecified, not intractable, without status migrainosus: Secondary | ICD-10-CM | POA: Diagnosis not present

## 2021-08-31 DIAGNOSIS — H04129 Dry eye syndrome of unspecified lacrimal gland: Secondary | ICD-10-CM | POA: Diagnosis not present

## 2021-08-31 DIAGNOSIS — J309 Allergic rhinitis, unspecified: Secondary | ICD-10-CM | POA: Diagnosis not present

## 2021-08-31 DIAGNOSIS — H9192 Unspecified hearing loss, left ear: Secondary | ICD-10-CM | POA: Diagnosis not present

## 2021-08-31 DIAGNOSIS — D638 Anemia in other chronic diseases classified elsewhere: Secondary | ICD-10-CM | POA: Diagnosis not present

## 2021-08-31 DIAGNOSIS — G464 Cerebellar stroke syndrome: Secondary | ICD-10-CM | POA: Diagnosis not present

## 2021-08-31 DIAGNOSIS — Q8503 Schwannomatosis: Secondary | ICD-10-CM | POA: Diagnosis not present

## 2021-08-31 DIAGNOSIS — E785 Hyperlipidemia, unspecified: Secondary | ICD-10-CM | POA: Diagnosis not present

## 2021-08-31 DIAGNOSIS — E538 Deficiency of other specified B group vitamins: Secondary | ICD-10-CM | POA: Diagnosis not present

## 2021-08-31 DIAGNOSIS — F330 Major depressive disorder, recurrent, mild: Secondary | ICD-10-CM | POA: Diagnosis not present

## 2021-08-31 DIAGNOSIS — F419 Anxiety disorder, unspecified: Secondary | ICD-10-CM | POA: Diagnosis not present

## 2021-08-31 DIAGNOSIS — E663 Overweight: Secondary | ICD-10-CM | POA: Diagnosis not present

## 2021-09-08 DIAGNOSIS — E559 Vitamin D deficiency, unspecified: Secondary | ICD-10-CM | POA: Diagnosis not present

## 2021-09-08 DIAGNOSIS — R5382 Chronic fatigue, unspecified: Secondary | ICD-10-CM | POA: Diagnosis not present

## 2021-09-08 DIAGNOSIS — K219 Gastro-esophageal reflux disease without esophagitis: Secondary | ICD-10-CM | POA: Diagnosis not present

## 2021-09-08 DIAGNOSIS — E7849 Other hyperlipidemia: Secondary | ICD-10-CM | POA: Diagnosis not present

## 2021-09-14 DIAGNOSIS — Z01419 Encounter for gynecological examination (general) (routine) without abnormal findings: Secondary | ICD-10-CM | POA: Diagnosis not present

## 2021-09-14 DIAGNOSIS — Z1231 Encounter for screening mammogram for malignant neoplasm of breast: Secondary | ICD-10-CM | POA: Diagnosis not present

## 2021-09-14 DIAGNOSIS — Z1212 Encounter for screening for malignant neoplasm of rectum: Secondary | ICD-10-CM | POA: Diagnosis not present

## 2021-09-15 DIAGNOSIS — I780 Hereditary hemorrhagic telangiectasia: Secondary | ICD-10-CM | POA: Diagnosis not present

## 2021-09-15 DIAGNOSIS — M4802 Spinal stenosis, cervical region: Secondary | ICD-10-CM | POA: Diagnosis not present

## 2021-09-15 DIAGNOSIS — M8580 Other specified disorders of bone density and structure, unspecified site: Secondary | ICD-10-CM | POA: Diagnosis not present

## 2021-09-15 DIAGNOSIS — Q8503 Schwannomatosis: Secondary | ICD-10-CM | POA: Diagnosis not present

## 2021-09-15 DIAGNOSIS — E7849 Other hyperlipidemia: Secondary | ICD-10-CM | POA: Diagnosis not present

## 2021-09-15 DIAGNOSIS — M47812 Spondylosis without myelopathy or radiculopathy, cervical region: Secondary | ICD-10-CM | POA: Diagnosis not present

## 2021-09-15 DIAGNOSIS — N393 Stress incontinence (female) (male): Secondary | ICD-10-CM | POA: Diagnosis not present

## 2021-09-15 DIAGNOSIS — Z6826 Body mass index (BMI) 26.0-26.9, adult: Secondary | ICD-10-CM | POA: Diagnosis not present

## 2021-09-15 DIAGNOSIS — I770 Arteriovenous fistula, acquired: Secondary | ICD-10-CM | POA: Diagnosis not present

## 2021-09-15 DIAGNOSIS — Z0001 Encounter for general adult medical examination with abnormal findings: Secondary | ICD-10-CM | POA: Diagnosis not present

## 2021-09-15 DIAGNOSIS — M5412 Radiculopathy, cervical region: Secondary | ICD-10-CM | POA: Diagnosis not present

## 2021-09-15 DIAGNOSIS — D509 Iron deficiency anemia, unspecified: Secondary | ICD-10-CM | POA: Diagnosis not present

## 2021-09-21 DIAGNOSIS — K219 Gastro-esophageal reflux disease without esophagitis: Secondary | ICD-10-CM | POA: Diagnosis not present

## 2021-09-21 DIAGNOSIS — H9192 Unspecified hearing loss, left ear: Secondary | ICD-10-CM | POA: Diagnosis not present

## 2021-09-21 DIAGNOSIS — D500 Iron deficiency anemia secondary to blood loss (chronic): Secondary | ICD-10-CM | POA: Diagnosis not present

## 2021-09-21 DIAGNOSIS — D72819 Decreased white blood cell count, unspecified: Secondary | ICD-10-CM | POA: Diagnosis not present

## 2021-09-21 DIAGNOSIS — K921 Melena: Secondary | ICD-10-CM | POA: Diagnosis not present

## 2021-09-21 DIAGNOSIS — R040 Epistaxis: Secondary | ICD-10-CM | POA: Diagnosis not present

## 2021-09-21 DIAGNOSIS — I780 Hereditary hemorrhagic telangiectasia: Secondary | ICD-10-CM | POA: Diagnosis not present

## 2021-09-21 DIAGNOSIS — G510 Bell's palsy: Secondary | ICD-10-CM | POA: Diagnosis not present

## 2021-09-21 DIAGNOSIS — G43909 Migraine, unspecified, not intractable, without status migrainosus: Secondary | ICD-10-CM | POA: Diagnosis not present

## 2021-09-21 DIAGNOSIS — Z86018 Personal history of other benign neoplasm: Secondary | ICD-10-CM | POA: Diagnosis not present

## 2021-09-21 DIAGNOSIS — Z8673 Personal history of transient ischemic attack (TIA), and cerebral infarction without residual deficits: Secondary | ICD-10-CM | POA: Diagnosis not present

## 2021-09-21 DIAGNOSIS — D509 Iron deficiency anemia, unspecified: Secondary | ICD-10-CM | POA: Diagnosis not present

## 2021-09-21 DIAGNOSIS — Z9889 Other specified postprocedural states: Secondary | ICD-10-CM | POA: Diagnosis not present

## 2021-09-21 DIAGNOSIS — E785 Hyperlipidemia, unspecified: Secondary | ICD-10-CM | POA: Diagnosis not present

## 2021-09-21 DIAGNOSIS — K5790 Diverticulosis of intestine, part unspecified, without perforation or abscess without bleeding: Secondary | ICD-10-CM | POA: Diagnosis not present

## 2021-09-21 DIAGNOSIS — Q2572 Congenital pulmonary arteriovenous malformation: Secondary | ICD-10-CM | POA: Diagnosis not present

## 2021-09-25 DIAGNOSIS — K31819 Angiodysplasia of stomach and duodenum without bleeding: Secondary | ICD-10-CM | POA: Diagnosis not present

## 2021-09-25 DIAGNOSIS — I780 Hereditary hemorrhagic telangiectasia: Secondary | ICD-10-CM | POA: Diagnosis not present

## 2021-09-25 DIAGNOSIS — K5520 Angiodysplasia of colon without hemorrhage: Secondary | ICD-10-CM | POA: Diagnosis not present

## 2021-10-19 DIAGNOSIS — K118 Other diseases of salivary glands: Secondary | ICD-10-CM | POA: Diagnosis not present

## 2021-10-19 DIAGNOSIS — K1379 Other lesions of oral mucosa: Secondary | ICD-10-CM | POA: Diagnosis not present

## 2021-10-19 DIAGNOSIS — R930 Abnormal findings on diagnostic imaging of skull and head, not elsewhere classified: Secondary | ICD-10-CM | POA: Diagnosis not present

## 2021-11-10 DIAGNOSIS — H0012 Chalazion right lower eyelid: Secondary | ICD-10-CM | POA: Diagnosis not present

## 2021-12-14 DIAGNOSIS — R0600 Dyspnea, unspecified: Secondary | ICD-10-CM | POA: Diagnosis not present

## 2021-12-14 DIAGNOSIS — Z8673 Personal history of transient ischemic attack (TIA), and cerebral infarction without residual deficits: Secondary | ICD-10-CM | POA: Diagnosis not present

## 2021-12-14 DIAGNOSIS — D508 Other iron deficiency anemias: Secondary | ICD-10-CM | POA: Diagnosis not present

## 2021-12-14 DIAGNOSIS — D649 Anemia, unspecified: Secondary | ICD-10-CM | POA: Diagnosis not present

## 2021-12-14 DIAGNOSIS — R0602 Shortness of breath: Secondary | ICD-10-CM | POA: Diagnosis not present

## 2021-12-14 DIAGNOSIS — R5383 Other fatigue: Secondary | ICD-10-CM | POA: Diagnosis not present

## 2021-12-14 DIAGNOSIS — D509 Iron deficiency anemia, unspecified: Secondary | ICD-10-CM | POA: Diagnosis not present

## 2021-12-16 DIAGNOSIS — D649 Anemia, unspecified: Secondary | ICD-10-CM | POA: Diagnosis not present

## 2021-12-21 DIAGNOSIS — D508 Other iron deficiency anemias: Secondary | ICD-10-CM | POA: Diagnosis not present

## 2021-12-28 DIAGNOSIS — D508 Other iron deficiency anemias: Secondary | ICD-10-CM | POA: Diagnosis not present

## 2021-12-28 DIAGNOSIS — D500 Iron deficiency anemia secondary to blood loss (chronic): Secondary | ICD-10-CM | POA: Diagnosis not present

## 2022-01-04 DIAGNOSIS — D509 Iron deficiency anemia, unspecified: Secondary | ICD-10-CM | POA: Diagnosis not present

## 2022-01-05 DIAGNOSIS — I780 Hereditary hemorrhagic telangiectasia: Secondary | ICD-10-CM | POA: Diagnosis not present

## 2022-01-05 DIAGNOSIS — Z881 Allergy status to other antibiotic agents status: Secondary | ICD-10-CM | POA: Diagnosis not present

## 2022-01-05 DIAGNOSIS — Z885 Allergy status to narcotic agent status: Secondary | ICD-10-CM | POA: Diagnosis not present

## 2022-01-05 DIAGNOSIS — J342 Deviated nasal septum: Secondary | ICD-10-CM | POA: Diagnosis not present

## 2022-01-05 DIAGNOSIS — J343 Hypertrophy of nasal turbinates: Secondary | ICD-10-CM | POA: Diagnosis not present

## 2022-01-05 DIAGNOSIS — R040 Epistaxis: Secondary | ICD-10-CM | POA: Diagnosis not present

## 2022-01-05 DIAGNOSIS — J3489 Other specified disorders of nose and nasal sinuses: Secondary | ICD-10-CM | POA: Diagnosis not present

## 2022-01-05 DIAGNOSIS — R7989 Other specified abnormal findings of blood chemistry: Secondary | ICD-10-CM | POA: Diagnosis not present

## 2022-01-05 DIAGNOSIS — Q282 Arteriovenous malformation of cerebral vessels: Secondary | ICD-10-CM | POA: Diagnosis not present

## 2022-01-11 DIAGNOSIS — R9389 Abnormal findings on diagnostic imaging of other specified body structures: Secondary | ICD-10-CM | POA: Diagnosis not present

## 2022-01-20 DIAGNOSIS — E785 Hyperlipidemia, unspecified: Secondary | ICD-10-CM | POA: Diagnosis not present

## 2022-01-20 DIAGNOSIS — D500 Iron deficiency anemia secondary to blood loss (chronic): Secondary | ICD-10-CM | POA: Diagnosis not present

## 2022-01-20 DIAGNOSIS — R002 Palpitations: Secondary | ICD-10-CM | POA: Diagnosis not present

## 2022-01-20 DIAGNOSIS — D649 Anemia, unspecified: Secondary | ICD-10-CM | POA: Diagnosis not present

## 2022-01-20 DIAGNOSIS — R0609 Other forms of dyspnea: Secondary | ICD-10-CM | POA: Diagnosis not present

## 2022-01-20 DIAGNOSIS — Z7982 Long term (current) use of aspirin: Secondary | ICD-10-CM | POA: Diagnosis not present

## 2022-01-28 DIAGNOSIS — D509 Iron deficiency anemia, unspecified: Secondary | ICD-10-CM | POA: Diagnosis not present

## 2022-01-28 DIAGNOSIS — I780 Hereditary hemorrhagic telangiectasia: Secondary | ICD-10-CM | POA: Diagnosis not present

## 2022-02-15 DIAGNOSIS — L570 Actinic keratosis: Secondary | ICD-10-CM | POA: Diagnosis not present

## 2022-03-15 DIAGNOSIS — G464 Cerebellar stroke syndrome: Secondary | ICD-10-CM | POA: Diagnosis not present

## 2022-03-15 DIAGNOSIS — Z1329 Encounter for screening for other suspected endocrine disorder: Secondary | ICD-10-CM | POA: Diagnosis not present

## 2022-03-15 DIAGNOSIS — K219 Gastro-esophageal reflux disease without esophagitis: Secondary | ICD-10-CM | POA: Diagnosis not present

## 2022-03-15 DIAGNOSIS — D518 Other vitamin B12 deficiency anemias: Secondary | ICD-10-CM | POA: Diagnosis not present

## 2022-03-15 DIAGNOSIS — E7849 Other hyperlipidemia: Secondary | ICD-10-CM | POA: Diagnosis not present

## 2022-03-18 DIAGNOSIS — Q8503 Schwannomatosis: Secondary | ICD-10-CM | POA: Diagnosis not present

## 2022-03-18 DIAGNOSIS — I770 Arteriovenous fistula, acquired: Secondary | ICD-10-CM | POA: Diagnosis not present

## 2022-03-18 DIAGNOSIS — Z1389 Encounter for screening for other disorder: Secondary | ICD-10-CM | POA: Diagnosis not present

## 2022-03-18 DIAGNOSIS — E7849 Other hyperlipidemia: Secondary | ICD-10-CM | POA: Diagnosis not present

## 2022-03-18 DIAGNOSIS — D509 Iron deficiency anemia, unspecified: Secondary | ICD-10-CM | POA: Diagnosis not present

## 2022-03-18 DIAGNOSIS — Z1331 Encounter for screening for depression: Secondary | ICD-10-CM | POA: Diagnosis not present

## 2022-03-18 DIAGNOSIS — M5412 Radiculopathy, cervical region: Secondary | ICD-10-CM | POA: Diagnosis not present

## 2022-03-18 DIAGNOSIS — H538 Other visual disturbances: Secondary | ICD-10-CM | POA: Diagnosis not present

## 2022-03-18 DIAGNOSIS — M4802 Spinal stenosis, cervical region: Secondary | ICD-10-CM | POA: Diagnosis not present

## 2022-03-18 DIAGNOSIS — M8580 Other specified disorders of bone density and structure, unspecified site: Secondary | ICD-10-CM | POA: Diagnosis not present

## 2022-03-18 DIAGNOSIS — I780 Hereditary hemorrhagic telangiectasia: Secondary | ICD-10-CM | POA: Diagnosis not present

## 2022-03-18 DIAGNOSIS — R030 Elevated blood-pressure reading, without diagnosis of hypertension: Secondary | ICD-10-CM | POA: Diagnosis not present

## 2022-03-25 DIAGNOSIS — Q282 Arteriovenous malformation of cerebral vessels: Secondary | ICD-10-CM | POA: Diagnosis not present

## 2022-03-25 DIAGNOSIS — D500 Iron deficiency anemia secondary to blood loss (chronic): Secondary | ICD-10-CM | POA: Diagnosis not present

## 2022-03-30 DIAGNOSIS — H538 Other visual disturbances: Secondary | ICD-10-CM | POA: Diagnosis not present

## 2022-03-30 DIAGNOSIS — I6523 Occlusion and stenosis of bilateral carotid arteries: Secondary | ICD-10-CM | POA: Diagnosis not present

## 2022-04-01 DIAGNOSIS — D500 Iron deficiency anemia secondary to blood loss (chronic): Secondary | ICD-10-CM | POA: Diagnosis not present

## 2022-04-28 DIAGNOSIS — G510 Bell's palsy: Secondary | ICD-10-CM | POA: Diagnosis not present

## 2022-04-28 DIAGNOSIS — Z86018 Personal history of other benign neoplasm: Secondary | ICD-10-CM | POA: Diagnosis not present

## 2022-04-29 DIAGNOSIS — I780 Hereditary hemorrhagic telangiectasia: Secondary | ICD-10-CM | POA: Diagnosis not present

## 2022-04-29 DIAGNOSIS — D500 Iron deficiency anemia secondary to blood loss (chronic): Secondary | ICD-10-CM | POA: Diagnosis not present

## 2022-06-03 DIAGNOSIS — J343 Hypertrophy of nasal turbinates: Secondary | ICD-10-CM | POA: Diagnosis not present

## 2022-06-03 DIAGNOSIS — R040 Epistaxis: Secondary | ICD-10-CM | POA: Diagnosis not present

## 2022-06-03 DIAGNOSIS — I780 Hereditary hemorrhagic telangiectasia: Secondary | ICD-10-CM | POA: Diagnosis not present

## 2022-06-03 DIAGNOSIS — R0982 Postnasal drip: Secondary | ICD-10-CM | POA: Diagnosis not present

## 2022-06-03 DIAGNOSIS — J342 Deviated nasal septum: Secondary | ICD-10-CM | POA: Diagnosis not present

## 2022-06-30 DIAGNOSIS — H02401 Unspecified ptosis of right eyelid: Secondary | ICD-10-CM | POA: Diagnosis not present

## 2022-06-30 DIAGNOSIS — D649 Anemia, unspecified: Secondary | ICD-10-CM | POA: Diagnosis not present

## 2022-06-30 DIAGNOSIS — G510 Bell's palsy: Secondary | ICD-10-CM | POA: Diagnosis not present

## 2022-06-30 DIAGNOSIS — I780 Hereditary hemorrhagic telangiectasia: Secondary | ICD-10-CM | POA: Diagnosis not present

## 2022-07-05 DIAGNOSIS — D500 Iron deficiency anemia secondary to blood loss (chronic): Secondary | ICD-10-CM | POA: Diagnosis not present

## 2022-07-05 DIAGNOSIS — D509 Iron deficiency anemia, unspecified: Secondary | ICD-10-CM | POA: Diagnosis not present

## 2022-07-05 DIAGNOSIS — D508 Other iron deficiency anemias: Secondary | ICD-10-CM | POA: Diagnosis not present

## 2022-07-05 DIAGNOSIS — D649 Anemia, unspecified: Secondary | ICD-10-CM | POA: Diagnosis not present

## 2022-07-12 DIAGNOSIS — D508 Other iron deficiency anemias: Secondary | ICD-10-CM | POA: Diagnosis not present

## 2022-07-12 DIAGNOSIS — D509 Iron deficiency anemia, unspecified: Secondary | ICD-10-CM | POA: Diagnosis not present

## 2022-07-12 DIAGNOSIS — D649 Anemia, unspecified: Secondary | ICD-10-CM | POA: Diagnosis not present

## 2022-07-12 DIAGNOSIS — D500 Iron deficiency anemia secondary to blood loss (chronic): Secondary | ICD-10-CM | POA: Diagnosis not present

## 2022-07-29 DIAGNOSIS — K5520 Angiodysplasia of colon without hemorrhage: Secondary | ICD-10-CM | POA: Diagnosis not present

## 2022-07-29 DIAGNOSIS — D500 Iron deficiency anemia secondary to blood loss (chronic): Secondary | ICD-10-CM | POA: Diagnosis not present

## 2022-08-01 DIAGNOSIS — G464 Cerebellar stroke syndrome: Secondary | ICD-10-CM | POA: Diagnosis not present

## 2022-08-01 DIAGNOSIS — K219 Gastro-esophageal reflux disease without esophagitis: Secondary | ICD-10-CM | POA: Diagnosis not present

## 2022-08-01 DIAGNOSIS — E782 Mixed hyperlipidemia: Secondary | ICD-10-CM | POA: Diagnosis not present

## 2022-08-02 DIAGNOSIS — Z1283 Encounter for screening for malignant neoplasm of skin: Secondary | ICD-10-CM | POA: Diagnosis not present

## 2022-08-02 DIAGNOSIS — Z85828 Personal history of other malignant neoplasm of skin: Secondary | ICD-10-CM | POA: Diagnosis not present

## 2022-08-02 DIAGNOSIS — D485 Neoplasm of uncertain behavior of skin: Secondary | ICD-10-CM | POA: Diagnosis not present

## 2022-08-02 DIAGNOSIS — L570 Actinic keratosis: Secondary | ICD-10-CM | POA: Diagnosis not present

## 2022-08-09 DIAGNOSIS — D509 Iron deficiency anemia, unspecified: Secondary | ICD-10-CM | POA: Diagnosis not present

## 2022-08-09 DIAGNOSIS — D500 Iron deficiency anemia secondary to blood loss (chronic): Secondary | ICD-10-CM | POA: Diagnosis not present

## 2022-08-13 DIAGNOSIS — D500 Iron deficiency anemia secondary to blood loss (chronic): Secondary | ICD-10-CM | POA: Diagnosis not present

## 2022-08-13 DIAGNOSIS — D649 Anemia, unspecified: Secondary | ICD-10-CM | POA: Diagnosis not present

## 2022-08-16 DIAGNOSIS — G464 Cerebellar stroke syndrome: Secondary | ICD-10-CM | POA: Diagnosis not present

## 2022-08-16 DIAGNOSIS — H04129 Dry eye syndrome of unspecified lacrimal gland: Secondary | ICD-10-CM | POA: Diagnosis not present

## 2022-08-16 DIAGNOSIS — F419 Anxiety disorder, unspecified: Secondary | ICD-10-CM | POA: Diagnosis not present

## 2022-08-16 DIAGNOSIS — E663 Overweight: Secondary | ICD-10-CM | POA: Diagnosis not present

## 2022-08-16 DIAGNOSIS — H9192 Unspecified hearing loss, left ear: Secondary | ICD-10-CM | POA: Diagnosis not present

## 2022-08-16 DIAGNOSIS — Q8503 Schwannomatosis: Secondary | ICD-10-CM | POA: Diagnosis not present

## 2022-08-16 DIAGNOSIS — G43909 Migraine, unspecified, not intractable, without status migrainosus: Secondary | ICD-10-CM | POA: Diagnosis not present

## 2022-08-16 DIAGNOSIS — I780 Hereditary hemorrhagic telangiectasia: Secondary | ICD-10-CM | POA: Diagnosis not present

## 2022-08-16 DIAGNOSIS — E538 Deficiency of other specified B group vitamins: Secondary | ICD-10-CM | POA: Diagnosis not present

## 2022-08-16 DIAGNOSIS — E785 Hyperlipidemia, unspecified: Secondary | ICD-10-CM | POA: Diagnosis not present

## 2022-08-16 DIAGNOSIS — D638 Anemia in other chronic diseases classified elsewhere: Secondary | ICD-10-CM | POA: Diagnosis not present

## 2022-08-16 DIAGNOSIS — I69354 Hemiplegia and hemiparesis following cerebral infarction affecting left non-dominant side: Secondary | ICD-10-CM | POA: Diagnosis not present

## 2022-08-27 DIAGNOSIS — K31819 Angiodysplasia of stomach and duodenum without bleeding: Secondary | ICD-10-CM | POA: Diagnosis not present

## 2022-08-27 DIAGNOSIS — D500 Iron deficiency anemia secondary to blood loss (chronic): Secondary | ICD-10-CM | POA: Diagnosis not present

## 2022-08-27 DIAGNOSIS — I780 Hereditary hemorrhagic telangiectasia: Secondary | ICD-10-CM | POA: Diagnosis not present

## 2022-08-27 DIAGNOSIS — K5520 Angiodysplasia of colon without hemorrhage: Secondary | ICD-10-CM | POA: Diagnosis not present

## 2022-08-27 DIAGNOSIS — Z7722 Contact with and (suspected) exposure to environmental tobacco smoke (acute) (chronic): Secondary | ICD-10-CM | POA: Diagnosis not present

## 2022-09-01 DIAGNOSIS — G510 Bell's palsy: Secondary | ICD-10-CM | POA: Diagnosis not present

## 2022-09-01 DIAGNOSIS — Z9889 Other specified postprocedural states: Secondary | ICD-10-CM | POA: Diagnosis not present

## 2022-09-03 DIAGNOSIS — Z6826 Body mass index (BMI) 26.0-26.9, adult: Secondary | ICD-10-CM | POA: Diagnosis not present

## 2022-09-03 DIAGNOSIS — H6992 Unspecified Eustachian tube disorder, left ear: Secondary | ICD-10-CM | POA: Diagnosis not present

## 2022-09-03 DIAGNOSIS — R030 Elevated blood-pressure reading, without diagnosis of hypertension: Secondary | ICD-10-CM | POA: Diagnosis not present

## 2022-09-09 DIAGNOSIS — D500 Iron deficiency anemia secondary to blood loss (chronic): Secondary | ICD-10-CM | POA: Diagnosis not present

## 2022-09-09 DIAGNOSIS — D649 Anemia, unspecified: Secondary | ICD-10-CM | POA: Diagnosis not present

## 2022-09-22 DIAGNOSIS — D509 Iron deficiency anemia, unspecified: Secondary | ICD-10-CM | POA: Diagnosis not present

## 2022-09-22 DIAGNOSIS — Z7722 Contact with and (suspected) exposure to environmental tobacco smoke (acute) (chronic): Secondary | ICD-10-CM | POA: Diagnosis not present

## 2022-09-22 DIAGNOSIS — E785 Hyperlipidemia, unspecified: Secondary | ICD-10-CM | POA: Diagnosis not present

## 2022-09-22 DIAGNOSIS — Z01818 Encounter for other preprocedural examination: Secondary | ICD-10-CM | POA: Diagnosis not present

## 2022-09-22 DIAGNOSIS — G510 Bell's palsy: Secondary | ICD-10-CM | POA: Diagnosis not present

## 2022-09-22 DIAGNOSIS — K219 Gastro-esophageal reflux disease without esophagitis: Secondary | ICD-10-CM | POA: Diagnosis not present

## 2022-09-22 DIAGNOSIS — D649 Anemia, unspecified: Secondary | ICD-10-CM | POA: Diagnosis not present

## 2022-09-22 DIAGNOSIS — J342 Deviated nasal septum: Secondary | ICD-10-CM | POA: Diagnosis not present

## 2022-09-22 DIAGNOSIS — D500 Iron deficiency anemia secondary to blood loss (chronic): Secondary | ICD-10-CM | POA: Diagnosis not present

## 2022-09-23 DIAGNOSIS — E559 Vitamin D deficiency, unspecified: Secondary | ICD-10-CM | POA: Diagnosis not present

## 2022-09-23 DIAGNOSIS — D519 Vitamin B12 deficiency anemia, unspecified: Secondary | ICD-10-CM | POA: Diagnosis not present

## 2022-09-23 DIAGNOSIS — D649 Anemia, unspecified: Secondary | ICD-10-CM | POA: Diagnosis not present

## 2022-09-23 DIAGNOSIS — E7849 Other hyperlipidemia: Secondary | ICD-10-CM | POA: Diagnosis not present

## 2022-09-23 DIAGNOSIS — Z0001 Encounter for general adult medical examination with abnormal findings: Secondary | ICD-10-CM | POA: Diagnosis not present

## 2022-09-23 DIAGNOSIS — E782 Mixed hyperlipidemia: Secondary | ICD-10-CM | POA: Diagnosis not present

## 2022-09-23 DIAGNOSIS — D529 Folate deficiency anemia, unspecified: Secondary | ICD-10-CM | POA: Diagnosis not present

## 2022-09-27 DIAGNOSIS — M4802 Spinal stenosis, cervical region: Secondary | ICD-10-CM | POA: Diagnosis not present

## 2022-09-27 DIAGNOSIS — M8580 Other specified disorders of bone density and structure, unspecified site: Secondary | ICD-10-CM | POA: Diagnosis not present

## 2022-09-27 DIAGNOSIS — I770 Arteriovenous fistula, acquired: Secondary | ICD-10-CM | POA: Diagnosis not present

## 2022-09-27 DIAGNOSIS — I780 Hereditary hemorrhagic telangiectasia: Secondary | ICD-10-CM | POA: Diagnosis not present

## 2022-09-27 DIAGNOSIS — R040 Epistaxis: Secondary | ICD-10-CM | POA: Diagnosis not present

## 2022-09-27 DIAGNOSIS — Q8503 Schwannomatosis: Secondary | ICD-10-CM | POA: Diagnosis not present

## 2022-09-27 DIAGNOSIS — Z0001 Encounter for general adult medical examination with abnormal findings: Secondary | ICD-10-CM | POA: Diagnosis not present

## 2022-09-27 DIAGNOSIS — R030 Elevated blood-pressure reading, without diagnosis of hypertension: Secondary | ICD-10-CM | POA: Diagnosis not present

## 2022-09-27 DIAGNOSIS — R944 Abnormal results of kidney function studies: Secondary | ICD-10-CM | POA: Diagnosis not present

## 2022-09-27 DIAGNOSIS — D509 Iron deficiency anemia, unspecified: Secondary | ICD-10-CM | POA: Diagnosis not present

## 2022-09-27 DIAGNOSIS — Z6826 Body mass index (BMI) 26.0-26.9, adult: Secondary | ICD-10-CM | POA: Diagnosis not present

## 2022-09-29 DIAGNOSIS — D500 Iron deficiency anemia secondary to blood loss (chronic): Secondary | ICD-10-CM | POA: Diagnosis not present

## 2022-09-29 DIAGNOSIS — D649 Anemia, unspecified: Secondary | ICD-10-CM | POA: Diagnosis not present

## 2022-10-01 DIAGNOSIS — H538 Other visual disturbances: Secondary | ICD-10-CM | POA: Diagnosis not present

## 2022-10-01 DIAGNOSIS — H02431 Paralytic ptosis of right eyelid: Secondary | ICD-10-CM | POA: Diagnosis not present

## 2022-10-01 DIAGNOSIS — H57812 Brow ptosis, left: Secondary | ICD-10-CM | POA: Diagnosis not present

## 2022-10-01 DIAGNOSIS — H57811 Brow ptosis, right: Secondary | ICD-10-CM | POA: Diagnosis not present

## 2022-10-01 DIAGNOSIS — H02234 Paralytic lagophthalmos left upper eyelid: Secondary | ICD-10-CM | POA: Diagnosis not present

## 2022-10-01 DIAGNOSIS — H02231 Paralytic lagophthalmos right upper eyelid: Secondary | ICD-10-CM | POA: Diagnosis not present

## 2022-10-01 DIAGNOSIS — H02236 Paralytic lagophthalmos left eye, unspecified eyelid: Secondary | ICD-10-CM | POA: Diagnosis not present

## 2022-10-01 DIAGNOSIS — H02401 Unspecified ptosis of right eyelid: Secondary | ICD-10-CM | POA: Diagnosis not present

## 2022-10-01 DIAGNOSIS — G510 Bell's palsy: Secondary | ICD-10-CM | POA: Diagnosis not present

## 2022-10-06 DIAGNOSIS — Z4889 Encounter for other specified surgical aftercare: Secondary | ICD-10-CM | POA: Diagnosis not present

## 2022-10-19 DIAGNOSIS — Z1231 Encounter for screening mammogram for malignant neoplasm of breast: Secondary | ICD-10-CM | POA: Diagnosis not present

## 2022-11-01 DIAGNOSIS — D509 Iron deficiency anemia, unspecified: Secondary | ICD-10-CM | POA: Diagnosis not present

## 2022-11-01 DIAGNOSIS — D519 Vitamin B12 deficiency anemia, unspecified: Secondary | ICD-10-CM | POA: Diagnosis not present

## 2022-11-01 DIAGNOSIS — E782 Mixed hyperlipidemia: Secondary | ICD-10-CM | POA: Diagnosis not present

## 2022-11-04 DIAGNOSIS — D649 Anemia, unspecified: Secondary | ICD-10-CM | POA: Diagnosis not present

## 2022-11-04 DIAGNOSIS — D500 Iron deficiency anemia secondary to blood loss (chronic): Secondary | ICD-10-CM | POA: Diagnosis not present

## 2022-11-05 DIAGNOSIS — G510 Bell's palsy: Secondary | ICD-10-CM | POA: Diagnosis not present

## 2022-11-05 DIAGNOSIS — K131 Cheek and lip biting: Secondary | ICD-10-CM | POA: Diagnosis not present

## 2022-11-05 DIAGNOSIS — R471 Dysarthria and anarthria: Secondary | ICD-10-CM | POA: Diagnosis not present

## 2022-11-05 DIAGNOSIS — Z9889 Other specified postprocedural states: Secondary | ICD-10-CM | POA: Diagnosis not present

## 2022-11-05 DIAGNOSIS — J3489 Other specified disorders of nose and nasal sinuses: Secondary | ICD-10-CM | POA: Diagnosis not present

## 2022-11-05 DIAGNOSIS — Z86018 Personal history of other benign neoplasm: Secondary | ICD-10-CM | POA: Diagnosis not present

## 2022-11-18 DIAGNOSIS — D500 Iron deficiency anemia secondary to blood loss (chronic): Secondary | ICD-10-CM | POA: Diagnosis not present

## 2022-12-07 DIAGNOSIS — J342 Deviated nasal septum: Secondary | ICD-10-CM | POA: Diagnosis not present

## 2022-12-07 DIAGNOSIS — R040 Epistaxis: Secondary | ICD-10-CM | POA: Diagnosis not present

## 2022-12-07 DIAGNOSIS — J343 Hypertrophy of nasal turbinates: Secondary | ICD-10-CM | POA: Diagnosis not present

## 2022-12-07 DIAGNOSIS — R0982 Postnasal drip: Secondary | ICD-10-CM | POA: Diagnosis not present

## 2022-12-07 DIAGNOSIS — I780 Hereditary hemorrhagic telangiectasia: Secondary | ICD-10-CM | POA: Diagnosis not present

## 2022-12-16 DIAGNOSIS — K5520 Angiodysplasia of colon without hemorrhage: Secondary | ICD-10-CM | POA: Diagnosis not present

## 2022-12-16 DIAGNOSIS — R040 Epistaxis: Secondary | ICD-10-CM | POA: Diagnosis not present

## 2022-12-16 DIAGNOSIS — I780 Hereditary hemorrhagic telangiectasia: Secondary | ICD-10-CM | POA: Diagnosis not present

## 2022-12-20 DIAGNOSIS — D500 Iron deficiency anemia secondary to blood loss (chronic): Secondary | ICD-10-CM | POA: Diagnosis not present

## 2022-12-27 DIAGNOSIS — D649 Anemia, unspecified: Secondary | ICD-10-CM | POA: Diagnosis not present

## 2022-12-27 DIAGNOSIS — D500 Iron deficiency anemia secondary to blood loss (chronic): Secondary | ICD-10-CM | POA: Diagnosis not present

## 2023-01-03 DIAGNOSIS — D500 Iron deficiency anemia secondary to blood loss (chronic): Secondary | ICD-10-CM | POA: Diagnosis not present

## 2023-01-03 DIAGNOSIS — D649 Anemia, unspecified: Secondary | ICD-10-CM | POA: Diagnosis not present

## 2023-01-10 DIAGNOSIS — G510 Bell's palsy: Secondary | ICD-10-CM | POA: Diagnosis not present

## 2023-01-12 DIAGNOSIS — Z85828 Personal history of other malignant neoplasm of skin: Secondary | ICD-10-CM | POA: Diagnosis not present

## 2023-01-12 DIAGNOSIS — D1801 Hemangioma of skin and subcutaneous tissue: Secondary | ICD-10-CM | POA: Diagnosis not present

## 2023-01-12 DIAGNOSIS — L570 Actinic keratosis: Secondary | ICD-10-CM | POA: Diagnosis not present

## 2023-01-12 DIAGNOSIS — L814 Other melanin hyperpigmentation: Secondary | ICD-10-CM | POA: Diagnosis not present

## 2023-01-17 DIAGNOSIS — D500 Iron deficiency anemia secondary to blood loss (chronic): Secondary | ICD-10-CM | POA: Diagnosis not present

## 2023-02-28 DIAGNOSIS — D500 Iron deficiency anemia secondary to blood loss (chronic): Secondary | ICD-10-CM | POA: Diagnosis not present

## 2023-02-28 DIAGNOSIS — E7800 Pure hypercholesterolemia, unspecified: Secondary | ICD-10-CM | POA: Diagnosis not present

## 2023-03-02 DIAGNOSIS — I639 Cerebral infarction, unspecified: Secondary | ICD-10-CM | POA: Diagnosis not present

## 2023-03-02 DIAGNOSIS — E7800 Pure hypercholesterolemia, unspecified: Secondary | ICD-10-CM | POA: Diagnosis not present

## 2023-03-02 DIAGNOSIS — R072 Precordial pain: Secondary | ICD-10-CM | POA: Diagnosis not present

## 2023-03-02 DIAGNOSIS — R002 Palpitations: Secondary | ICD-10-CM | POA: Diagnosis not present

## 2023-03-02 DIAGNOSIS — R0609 Other forms of dyspnea: Secondary | ICD-10-CM | POA: Diagnosis not present

## 2023-03-02 DIAGNOSIS — I780 Hereditary hemorrhagic telangiectasia: Secondary | ICD-10-CM | POA: Diagnosis not present

## 2023-03-02 DIAGNOSIS — D508 Other iron deficiency anemias: Secondary | ICD-10-CM | POA: Diagnosis not present

## 2023-03-02 DIAGNOSIS — R5383 Other fatigue: Secondary | ICD-10-CM | POA: Diagnosis not present

## 2023-03-05 DIAGNOSIS — R079 Chest pain, unspecified: Secondary | ICD-10-CM | POA: Diagnosis not present

## 2023-03-09 DIAGNOSIS — R072 Precordial pain: Secondary | ICD-10-CM | POA: Diagnosis not present

## 2023-03-09 DIAGNOSIS — R079 Chest pain, unspecified: Secondary | ICD-10-CM | POA: Diagnosis not present

## 2023-03-09 DIAGNOSIS — R5383 Other fatigue: Secondary | ICD-10-CM | POA: Diagnosis not present

## 2023-03-09 DIAGNOSIS — R0609 Other forms of dyspnea: Secondary | ICD-10-CM | POA: Diagnosis not present

## 2023-03-14 DIAGNOSIS — D500 Iron deficiency anemia secondary to blood loss (chronic): Secondary | ICD-10-CM | POA: Diagnosis not present

## 2023-03-23 DIAGNOSIS — R944 Abnormal results of kidney function studies: Secondary | ICD-10-CM | POA: Diagnosis not present

## 2023-03-23 DIAGNOSIS — Z1329 Encounter for screening for other suspected endocrine disorder: Secondary | ICD-10-CM | POA: Diagnosis not present

## 2023-03-23 DIAGNOSIS — E782 Mixed hyperlipidemia: Secondary | ICD-10-CM | POA: Diagnosis not present

## 2023-03-23 DIAGNOSIS — D509 Iron deficiency anemia, unspecified: Secondary | ICD-10-CM | POA: Diagnosis not present

## 2023-03-23 DIAGNOSIS — D529 Folate deficiency anemia, unspecified: Secondary | ICD-10-CM | POA: Diagnosis not present

## 2023-03-23 DIAGNOSIS — D518 Other vitamin B12 deficiency anemias: Secondary | ICD-10-CM | POA: Diagnosis not present

## 2023-03-23 DIAGNOSIS — E7849 Other hyperlipidemia: Secondary | ICD-10-CM | POA: Diagnosis not present

## 2023-03-23 DIAGNOSIS — D649 Anemia, unspecified: Secondary | ICD-10-CM | POA: Diagnosis not present

## 2023-03-28 DIAGNOSIS — R5383 Other fatigue: Secondary | ICD-10-CM | POA: Diagnosis not present

## 2023-03-28 DIAGNOSIS — F32A Depression, unspecified: Secondary | ICD-10-CM | POA: Diagnosis not present

## 2023-03-28 DIAGNOSIS — D500 Iron deficiency anemia secondary to blood loss (chronic): Secondary | ICD-10-CM | POA: Diagnosis not present

## 2023-03-30 DIAGNOSIS — I780 Hereditary hemorrhagic telangiectasia: Secondary | ICD-10-CM | POA: Diagnosis not present

## 2023-03-30 DIAGNOSIS — Q8503 Schwannomatosis: Secondary | ICD-10-CM | POA: Diagnosis not present

## 2023-03-30 DIAGNOSIS — Z6825 Body mass index (BMI) 25.0-25.9, adult: Secondary | ICD-10-CM | POA: Diagnosis not present

## 2023-03-30 DIAGNOSIS — R944 Abnormal results of kidney function studies: Secondary | ICD-10-CM | POA: Diagnosis not present

## 2023-03-30 DIAGNOSIS — E782 Mixed hyperlipidemia: Secondary | ICD-10-CM | POA: Diagnosis not present

## 2023-03-30 DIAGNOSIS — R300 Dysuria: Secondary | ICD-10-CM | POA: Diagnosis not present

## 2023-04-07 DIAGNOSIS — H25813 Combined forms of age-related cataract, bilateral: Secondary | ICD-10-CM | POA: Diagnosis not present

## 2023-04-08 DIAGNOSIS — I780 Hereditary hemorrhagic telangiectasia: Secondary | ICD-10-CM | POA: Diagnosis not present

## 2023-04-08 DIAGNOSIS — J342 Deviated nasal septum: Secondary | ICD-10-CM | POA: Diagnosis not present

## 2023-04-08 DIAGNOSIS — R040 Epistaxis: Secondary | ICD-10-CM | POA: Diagnosis not present

## 2023-04-08 DIAGNOSIS — R0982 Postnasal drip: Secondary | ICD-10-CM | POA: Diagnosis not present

## 2023-04-08 DIAGNOSIS — J343 Hypertrophy of nasal turbinates: Secondary | ICD-10-CM | POA: Diagnosis not present

## 2023-04-13 DIAGNOSIS — R072 Precordial pain: Secondary | ICD-10-CM | POA: Diagnosis not present

## 2023-04-13 DIAGNOSIS — E7800 Pure hypercholesterolemia, unspecified: Secondary | ICD-10-CM | POA: Diagnosis not present

## 2023-04-13 DIAGNOSIS — I780 Hereditary hemorrhagic telangiectasia: Secondary | ICD-10-CM | POA: Diagnosis not present

## 2023-04-13 DIAGNOSIS — I639 Cerebral infarction, unspecified: Secondary | ICD-10-CM | POA: Diagnosis not present

## 2023-04-13 DIAGNOSIS — D500 Iron deficiency anemia secondary to blood loss (chronic): Secondary | ICD-10-CM | POA: Diagnosis not present

## 2023-04-21 DIAGNOSIS — I780 Hereditary hemorrhagic telangiectasia: Secondary | ICD-10-CM | POA: Diagnosis not present

## 2023-04-21 DIAGNOSIS — D500 Iron deficiency anemia secondary to blood loss (chronic): Secondary | ICD-10-CM | POA: Diagnosis not present

## 2023-04-25 DIAGNOSIS — R072 Precordial pain: Secondary | ICD-10-CM | POA: Diagnosis not present

## 2023-04-28 DIAGNOSIS — D500 Iron deficiency anemia secondary to blood loss (chronic): Secondary | ICD-10-CM | POA: Diagnosis not present

## 2023-04-28 DIAGNOSIS — R5383 Other fatigue: Secondary | ICD-10-CM | POA: Diagnosis not present

## 2023-05-11 DIAGNOSIS — K31811 Angiodysplasia of stomach and duodenum with bleeding: Secondary | ICD-10-CM | POA: Diagnosis not present

## 2023-05-11 DIAGNOSIS — Z7982 Long term (current) use of aspirin: Secondary | ICD-10-CM | POA: Diagnosis not present

## 2023-05-11 DIAGNOSIS — K31819 Angiodysplasia of stomach and duodenum without bleeding: Secondary | ICD-10-CM | POA: Diagnosis not present

## 2023-05-11 DIAGNOSIS — Z79899 Other long term (current) drug therapy: Secondary | ICD-10-CM | POA: Diagnosis not present

## 2023-05-11 DIAGNOSIS — Q2733 Arteriovenous malformation of digestive system vessel: Secondary | ICD-10-CM | POA: Diagnosis not present

## 2023-05-11 DIAGNOSIS — G43109 Migraine with aura, not intractable, without status migrainosus: Secondary | ICD-10-CM | POA: Diagnosis not present

## 2023-05-11 DIAGNOSIS — Z8673 Personal history of transient ischemic attack (TIA), and cerebral infarction without residual deficits: Secondary | ICD-10-CM | POA: Diagnosis not present

## 2023-05-11 DIAGNOSIS — D500 Iron deficiency anemia secondary to blood loss (chronic): Secondary | ICD-10-CM | POA: Diagnosis not present

## 2023-05-11 DIAGNOSIS — K449 Diaphragmatic hernia without obstruction or gangrene: Secondary | ICD-10-CM | POA: Diagnosis not present

## 2023-05-11 DIAGNOSIS — I780 Hereditary hemorrhagic telangiectasia: Secondary | ICD-10-CM | POA: Diagnosis not present

## 2023-05-26 DIAGNOSIS — H02412 Mechanical ptosis of left eyelid: Secondary | ICD-10-CM | POA: Diagnosis not present

## 2023-05-26 DIAGNOSIS — H2513 Age-related nuclear cataract, bilateral: Secondary | ICD-10-CM | POA: Diagnosis not present

## 2023-05-26 DIAGNOSIS — H01001 Unspecified blepharitis right upper eyelid: Secondary | ICD-10-CM | POA: Diagnosis not present

## 2023-05-26 DIAGNOSIS — D500 Iron deficiency anemia secondary to blood loss (chronic): Secondary | ICD-10-CM | POA: Diagnosis not present

## 2023-05-26 DIAGNOSIS — H02051 Trichiasis without entropian right upper eyelid: Secondary | ICD-10-CM | POA: Diagnosis not present

## 2023-05-26 DIAGNOSIS — D649 Anemia, unspecified: Secondary | ICD-10-CM | POA: Diagnosis not present

## 2023-05-26 DIAGNOSIS — H01004 Unspecified blepharitis left upper eyelid: Secondary | ICD-10-CM | POA: Diagnosis not present

## 2023-05-26 DIAGNOSIS — H52223 Regular astigmatism, bilateral: Secondary | ICD-10-CM | POA: Diagnosis not present

## 2023-05-26 DIAGNOSIS — H02831 Dermatochalasis of right upper eyelid: Secondary | ICD-10-CM | POA: Diagnosis not present

## 2023-05-26 DIAGNOSIS — H02834 Dermatochalasis of left upper eyelid: Secondary | ICD-10-CM | POA: Diagnosis not present

## 2023-06-02 DIAGNOSIS — D509 Iron deficiency anemia, unspecified: Secondary | ICD-10-CM | POA: Diagnosis not present

## 2023-06-02 DIAGNOSIS — D500 Iron deficiency anemia secondary to blood loss (chronic): Secondary | ICD-10-CM | POA: Diagnosis not present

## 2023-06-02 DIAGNOSIS — D649 Anemia, unspecified: Secondary | ICD-10-CM | POA: Diagnosis not present

## 2023-06-09 DIAGNOSIS — D509 Iron deficiency anemia, unspecified: Secondary | ICD-10-CM | POA: Diagnosis not present

## 2023-06-09 DIAGNOSIS — D649 Anemia, unspecified: Secondary | ICD-10-CM | POA: Diagnosis not present

## 2023-06-09 DIAGNOSIS — D500 Iron deficiency anemia secondary to blood loss (chronic): Secondary | ICD-10-CM | POA: Diagnosis not present

## 2023-06-19 IMAGING — CT CT ORBITS W/ CM
3 of 6 series · 13 of 47 positions shown, 15 images · IV contrast (agent unspecified)
Comparison: Maxillofacial CT 12/24/2019

CLINICAL DATA: Chemosis of the left eye following surgery. History
of right lower eyelid basal cell carcinoma.

EXAM:
CT ORBITS WITH CONTRAST
TECHNIQUE: Multidetector CT images was performed according to the standard
protocol following intravenous contrast administration.

[Series 3: orbits 2.0 hr60 bone · axial · 0.33mm/px · z∈[-170,-98]mm · 8 of 43 slices shown, 10 images]
[im 4/43  brain]
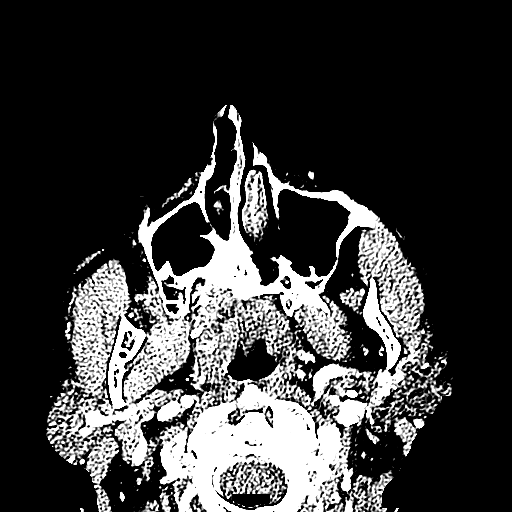
[im 4/43  bone]
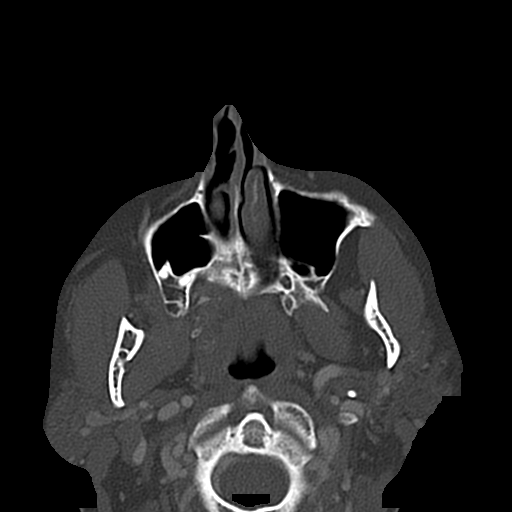
[im 10/43  bone]
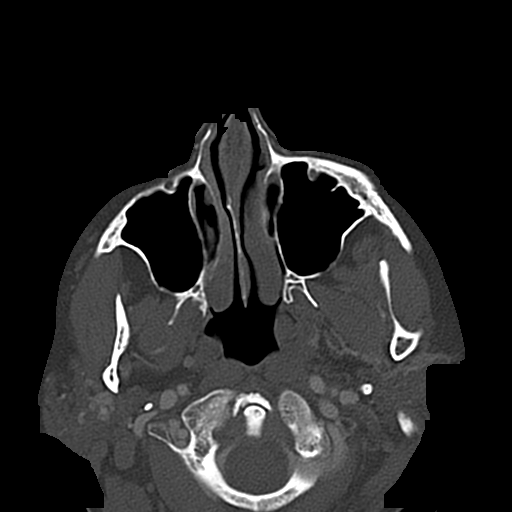
[im 16/43  bone]
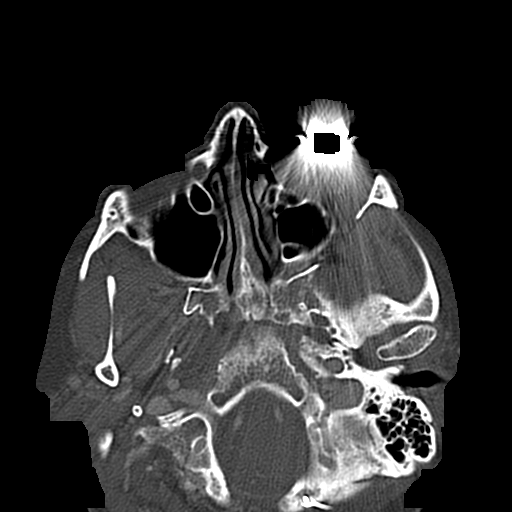
[im 19/43  bone]
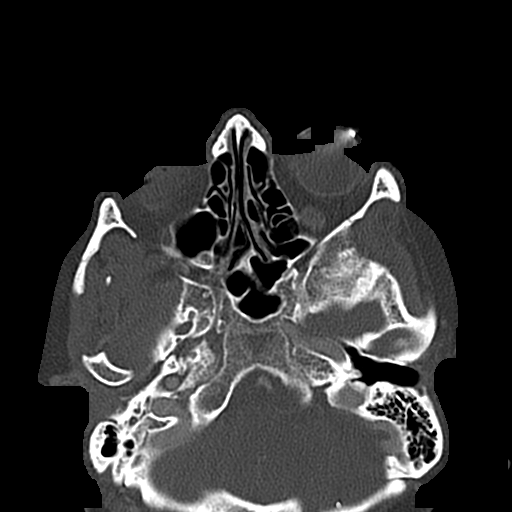
[im 25/43  brain]
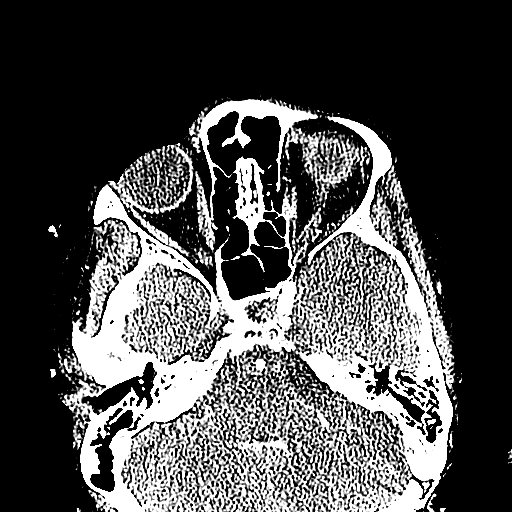
[im 25/43  bone]
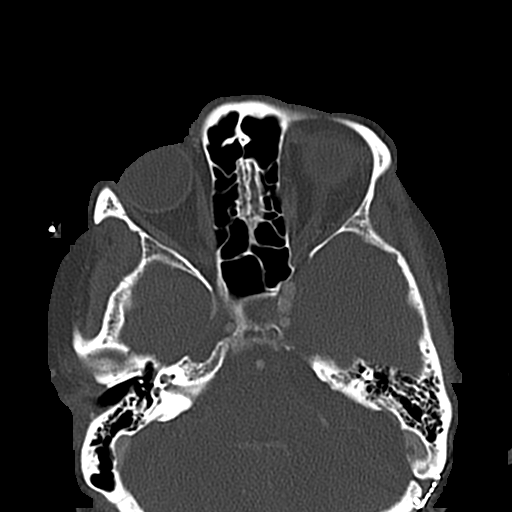
[im 28/43  bone]
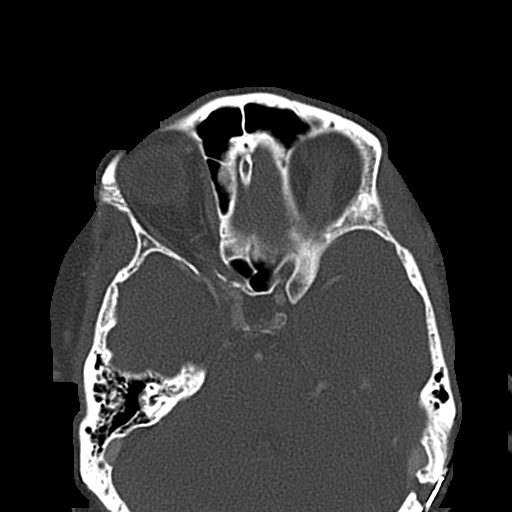
[im 34/43  bone]
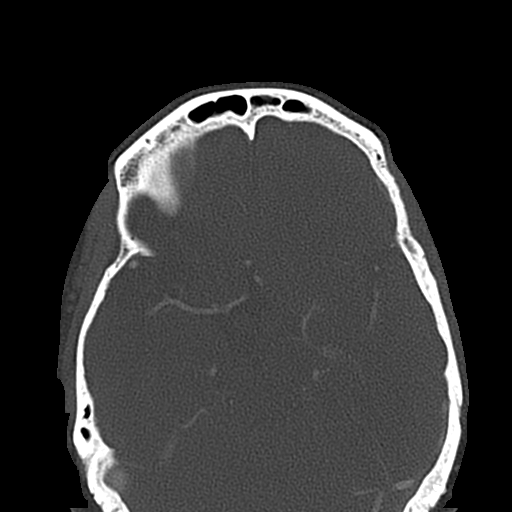
[im 40/43  bone]
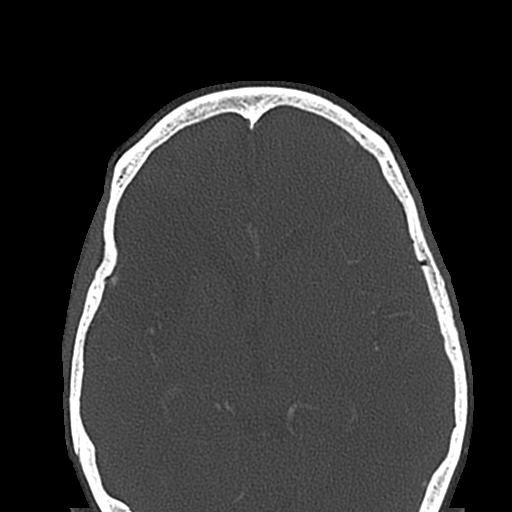

[Series 7: orbits 2.0 coronal · coronal · 0.18mm/px · 3 of 76 slices shown]
[im 19/76  bone]
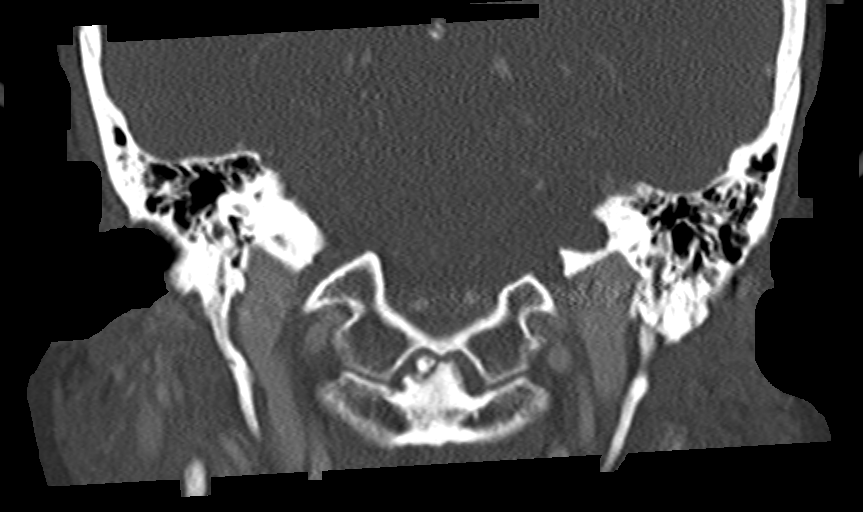
[im 38/76  bone]
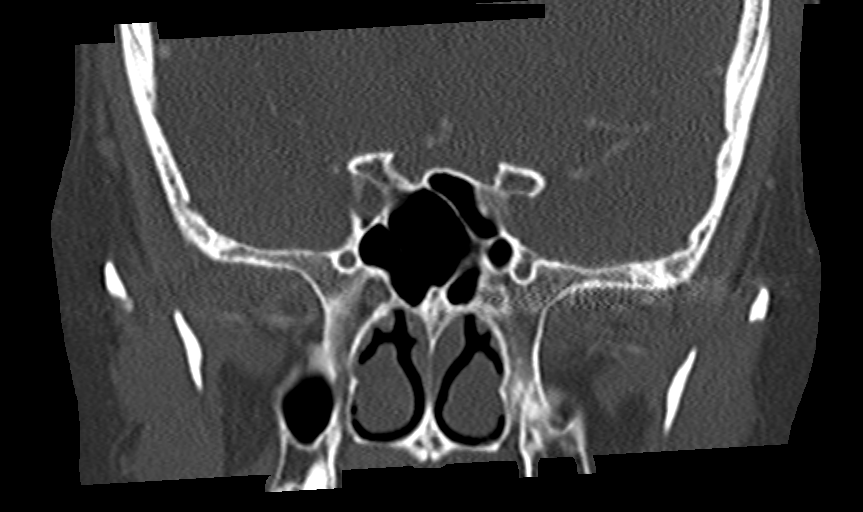
[im 57/76  bone]
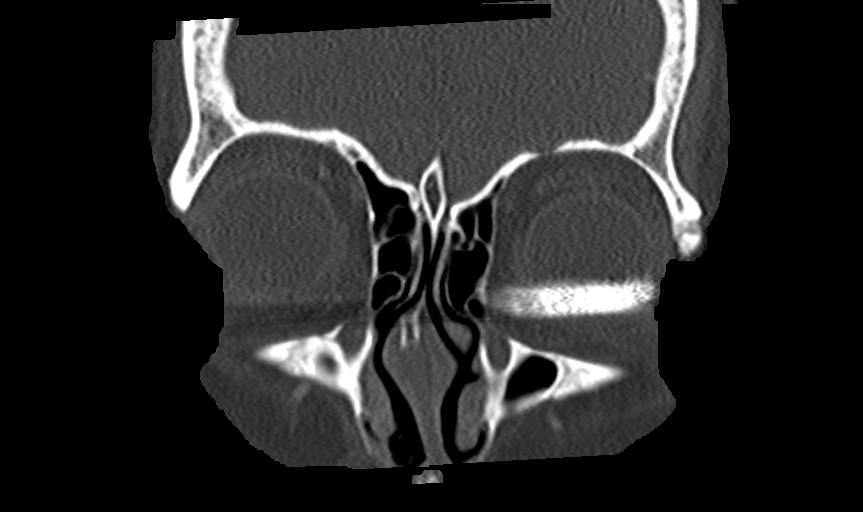

[Series 10: orbits 2.0 sagittal · sagittal · 0.18mm/px · 2 of 78 slices shown]
[im 26/78  bone]
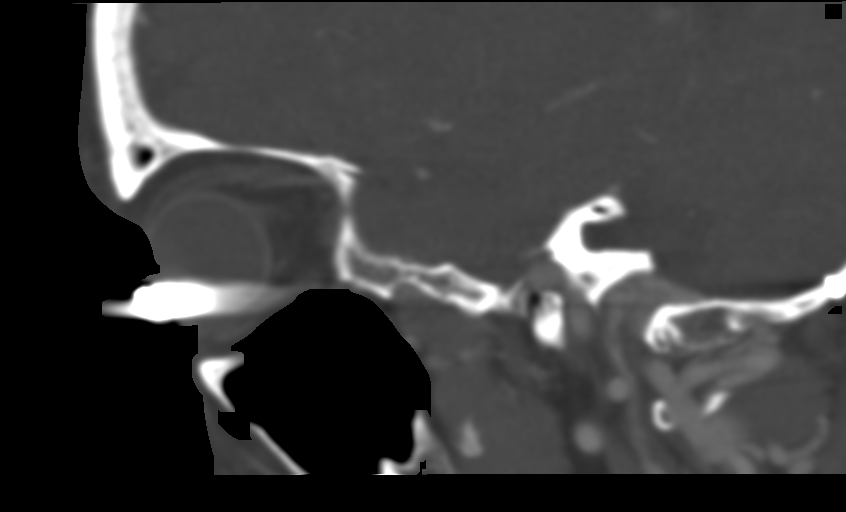
[im 52/78  bone]
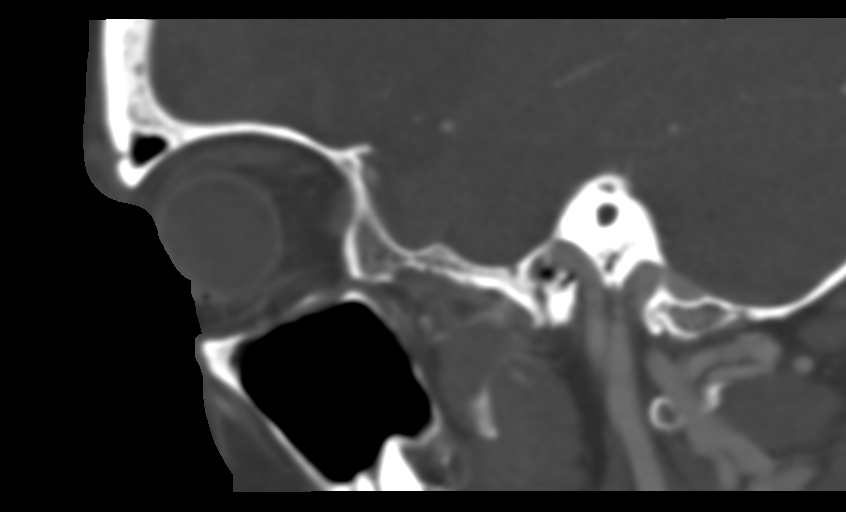

[13 of 47 positions shown; findings below may reference images not displayed]

RADIATION DOSE REDUCTION: This exam was performed according to the
departmental dose-optimization program which includes automated
exposure control, adjustment of the mA and/or kV according to
patient size and/or use of iterative reconstruction technique.

CONTRAST:  75mL OMNIPAQUE IOHEXOL 300 MG/ML  SOLN
FINDINGS: Orbits: Right: The globe is intact. There is no abnormal enhancement
of the globe. The retrobulbar fat is preserved. There is no
retrobulbar mass lesion or abnormal enhancement. The extraocular
muscles are normal. The optic nerve is normal.

Left: An eyelid weight is noted with significant streak artifact,
degrading evaluation of the left globe and orbit. Within this
confine, the globe is unremarkable. The retrobulbar fat is normal.
There is no retrobulbar mass lesion or abnormal enhancement. The
extraocular muscles are normal. The optic nerve is normal.

Visible paranasal sinuses: Clear.

Soft tissues: Unremarkable.

Osseous: There is no acute osseous abnormality or suspicious osseous
lesion.

Limited intracranial: There is encephalomalacia in the right
anterior temporal lobe, unchanged. The imaged portions of the
intracranial compartment are otherwise unremarkable.
IMPRESSION: 1. Left eyelid weight in place with significant streak artifact
degrading evaluation of the left globe. Within this confine, the
left globe is unremarkable. No abnormal mass lesion or enhancement
is identified.
2. Unremarkable right globe. No abnormal enhancement or mass lesion
identified.

## 2023-07-04 DIAGNOSIS — M5033 Other cervical disc degeneration, cervicothoracic region: Secondary | ICD-10-CM | POA: Diagnosis not present

## 2023-07-04 DIAGNOSIS — M9901 Segmental and somatic dysfunction of cervical region: Secondary | ICD-10-CM | POA: Diagnosis not present

## 2023-07-04 DIAGNOSIS — H25812 Combined forms of age-related cataract, left eye: Secondary | ICD-10-CM | POA: Diagnosis not present

## 2023-07-05 DIAGNOSIS — M5033 Other cervical disc degeneration, cervicothoracic region: Secondary | ICD-10-CM | POA: Diagnosis not present

## 2023-07-05 DIAGNOSIS — M9901 Segmental and somatic dysfunction of cervical region: Secondary | ICD-10-CM | POA: Diagnosis not present

## 2023-07-06 ENCOUNTER — Encounter (HOSPITAL_COMMUNITY)

## 2023-07-06 DIAGNOSIS — M9901 Segmental and somatic dysfunction of cervical region: Secondary | ICD-10-CM | POA: Diagnosis not present

## 2023-07-06 DIAGNOSIS — M5033 Other cervical disc degeneration, cervicothoracic region: Secondary | ICD-10-CM | POA: Diagnosis not present

## 2023-07-06 NOTE — H&P (Signed)
 Surgical History & Physical  Patient Name: Danielle [name]  DOB: [birth year]  Surgery: Cataract extraction with intraocular lens implant phacoemulsification; Left Eye Surgeon: Tarri Farm MD Surgery Date: 07/11/2023 Pre-Op Date: 05/26/2023  HPI: A 32 Yr. old female patient present for cataract eval per Dr. Barbra Boone.  1. The patient complains of difficulty when seeing street signs, reading, watching TV, all activities of daily living, which began 1 year ago. The left eye is affected. The symptoms are constant. Patient noticed vision getting worse OS. When she seen Dr. Barbra Boone, he told her it was 3 times worse than a year ago. This is negatively affecting the patient\'s quality of life and the patient is unable to function adequately in life with the current level of vision.  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 She had a surgery to remove it. At the time her nerve as cut. 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 She had to have it replaced with a smaller weight since then. Using BioTrue every few hours. HPI Completed by Dr. Tarri Farm  Medical History: Dry Eyes Cataracts  Arthritis Cancer Heart Problem LDL Stroke anemia, anxiety, depression  Review of Systems Cardiovascular High Blood Pressure, palpitations Musculoskeletal arthritis pains Neurological Stroke Psychiatry Anxiety, Depression All recorded systems are negative except as noted above.  Social Never smoked   Medication Fluconazole, Sertraline, Nitroglycerin, Esomeprazole magnesium, Rosuvastatin, Mupirocin  Sx/Procedures Weight in lid LUL,  Hysterectomy, C Section x2  Drug Allergies  Sulfa (Sulfonamide Antibiotics), Codeine  History & Physical: Heent: cataracts NECK: supple without bruits LUNGS: lungs clear to auscultation CV: regular rate and rhythm Abdomen: soft and non-tender  Impression & Plan: Assessment: 1.  NUCLEAR SCLEROSIS AGE RELATED; Both Eyes (H25.13) 2.   PTOSIS MECHANICAL; Left Eye (H02.412) 3.  DERMATOCHALASIS, no surgery; Right Upper Lid, Left Upper Lid (H02.831, H02.834) 4.  TRICHIASIS NO ENTROPION; Right Upper Lid (H02.051) 5.  BLEPHARITIS; Right Upper Lid, Left Upper Lid (H01.001, H01.004) 6.  ASTIGMATISM, REGULAR; Both Eyes (H52.223)  Plan: 1.  Cataract accounts for the patient's decreased vision. This visual impairment is not correctable with a tolerable change in glasses or contact lenses. Cataract surgery with an implantation of a new lens should significantly improve the visual and functional status of the patient. Discussed all risks, benefits, alternatives, and potential complications. Discussed the procedures and recovery. Patient desires to have surgery. A-scan ordered and performed today for intra-ocular lens calculations. The surgery will be performed in order to improve vision for driving, reading, and for eye examinations. Recommend phacoemulsification with intra-ocular lens. Recommend Dextenza  for post-operative pain and inflammation. Left Eye worse - first. Dilates well - shugarcaine by protocol. Standard Lens.  2.  Platinum weight LUL. Findings, prognosis and treatment options reviewed. s/p Brain tumor from brain stem removed 2009, causing nerve damage and leaving UL ptosis.  3.  Asymptomatic, recommend observation for now. Findings, prognosis and treatment options reviewed.  4.  Lashes removed with jeweler's forceps. No complications.  5.  Blepharitis is present - recommend regular lid cleaning.  6.  OS worse. May be due to platinum weight pushing on eye.

## 2023-07-07 ENCOUNTER — Encounter (HOSPITAL_COMMUNITY)
Admission: RE | Admit: 2023-07-07 | Discharge: 2023-07-07 | Disposition: A | Discharge status: Home / Self Care | Source: Ambulatory Visit | Attending: Ophthalmology | Admitting: Ophthalmology

## 2023-07-07 ENCOUNTER — Encounter (HOSPITAL_COMMUNITY): Payer: Self-pay

## 2023-07-07 DIAGNOSIS — D500 Iron deficiency anemia secondary to blood loss (chronic): Secondary | ICD-10-CM | POA: Diagnosis not present

## 2023-07-11 ENCOUNTER — Ambulatory Visit (HOSPITAL_COMMUNITY): Admitting: Anesthesiology

## 2023-07-11 ENCOUNTER — Encounter (HOSPITAL_COMMUNITY)
Admission: RE | Disposition: A | Discharge status: Home / Self Care | Payer: Self-pay | Source: Home / Self Care | Attending: Ophthalmology

## 2023-07-11 ENCOUNTER — Ambulatory Visit (HOSPITAL_COMMUNITY)
Admission: RE | Admit: 2023-07-11 | Discharge: 2023-07-11 | Disposition: A | Discharge status: Home / Self Care | Attending: Ophthalmology | Admitting: Ophthalmology

## 2023-07-11 DIAGNOSIS — H01004 Unspecified blepharitis left upper eyelid: Secondary | ICD-10-CM | POA: Diagnosis not present

## 2023-07-11 DIAGNOSIS — M1990 Unspecified osteoarthritis, unspecified site: Secondary | ICD-10-CM | POA: Diagnosis not present

## 2023-07-11 DIAGNOSIS — H02834 Dermatochalasis of left upper eyelid: Secondary | ICD-10-CM | POA: Insufficient documentation

## 2023-07-11 DIAGNOSIS — Z8673 Personal history of transient ischemic attack (TIA), and cerebral infarction without residual deficits: Secondary | ICD-10-CM | POA: Insufficient documentation

## 2023-07-11 DIAGNOSIS — I780 Hereditary hemorrhagic telangiectasia: Secondary | ICD-10-CM | POA: Insufficient documentation

## 2023-07-11 DIAGNOSIS — H02831 Dermatochalasis of right upper eyelid: Secondary | ICD-10-CM | POA: Insufficient documentation

## 2023-07-11 DIAGNOSIS — H25812 Combined forms of age-related cataract, left eye: Secondary | ICD-10-CM | POA: Diagnosis not present

## 2023-07-11 DIAGNOSIS — H2513 Age-related nuclear cataract, bilateral: Secondary | ICD-10-CM | POA: Insufficient documentation

## 2023-07-11 DIAGNOSIS — H02412 Mechanical ptosis of left eyelid: Secondary | ICD-10-CM | POA: Insufficient documentation

## 2023-07-11 DIAGNOSIS — H52223 Regular astigmatism, bilateral: Secondary | ICD-10-CM | POA: Diagnosis not present

## 2023-07-11 DIAGNOSIS — H01001 Unspecified blepharitis right upper eyelid: Secondary | ICD-10-CM | POA: Diagnosis not present

## 2023-07-11 DIAGNOSIS — H5712 Ocular pain, left eye: Secondary | ICD-10-CM | POA: Diagnosis not present

## 2023-07-11 DIAGNOSIS — I679 Cerebrovascular disease, unspecified: Secondary | ICD-10-CM

## 2023-07-11 DIAGNOSIS — H02051 Trichiasis without entropian right upper eyelid: Secondary | ICD-10-CM | POA: Insufficient documentation

## 2023-07-11 HISTORY — PX: INSERTION, STENT, DRUG-ELUTING, LACRIMAL CANALICULUS: SHX7453

## 2023-07-11 HISTORY — PX: CATARACT EXTRACTION W/PHACO: SHX586

## 2023-07-11 SURGERY — PHACOEMULSIFICATION, CATARACT, WITH IOL INSERTION
Anesthesia: Monitor Anesthesia Care | Site: Eye | Laterality: Left

## 2023-07-11 MED ORDER — POVIDONE-IODINE 5 % OP SOLN
OPHTHALMIC | Status: DC | PRN
  Administered 2023-07-11: 1 via OPHTHALMIC

## 2023-07-11 MED ORDER — MIDAZOLAM HCL 2 MG/2ML IJ SOLN
INTRAMUSCULAR | Status: AC
  Filled 2023-07-11: qty 2

## 2023-07-11 MED ORDER — DEXAMETHASONE 0.4 MG OP INST
VAGINAL_INSERT | OPHTHALMIC | Status: AC
  Filled 2023-07-11: qty 1

## 2023-07-11 MED ORDER — SODIUM HYALURONATE 23MG/ML IO SOSY
PREFILLED_SYRINGE | INTRAOCULAR | Status: DC | PRN
  Administered 2023-07-11: .6 mL via INTRAOCULAR

## 2023-07-11 MED ORDER — LIDOCAINE HCL (PF) 1 % IJ SOLN
INTRAOCULAR | Status: DC | PRN
  Administered 2023-07-11: 1 mL via OPHTHALMIC

## 2023-07-11 MED ORDER — STERILE WATER FOR IRRIGATION IR SOLN
Status: DC | PRN
  Administered 2023-07-11: 25 mL

## 2023-07-11 MED ORDER — PHENYLEPHRINE-KETOROLAC 1-0.3 % IO SOLN
INTRAOCULAR | Status: DC | PRN
  Administered 2023-07-11: 500 mL via OPHTHALMIC

## 2023-07-11 MED ORDER — SODIUM HYALURONATE 10 MG/ML IO SOLUTION
PREFILLED_SYRINGE | INTRAOCULAR | Status: DC | PRN
  Administered 2023-07-11: .85 mL via INTRAOCULAR

## 2023-07-11 MED ORDER — PHENYLEPHRINE HCL 2.5 % OP SOLN
1.0000 [drp] | OPHTHALMIC | Status: AC | PRN
  Administered 2023-07-11 (×3): 1 [drp] via OPHTHALMIC

## 2023-07-11 MED ORDER — BSS IO SOLN
INTRAOCULAR | Status: DC | PRN
  Administered 2023-07-11: 15 mL via INTRAOCULAR

## 2023-07-11 MED ORDER — MIDAZOLAM HCL 5 MG/5ML IJ SOLN
INTRAMUSCULAR | Status: DC | PRN
  Administered 2023-07-11: 2 mg via INTRAVENOUS

## 2023-07-11 MED ORDER — LIDOCAINE HCL 3.5 % OP GEL
1.0000 | Freq: Once | OPHTHALMIC | Status: AC
  Administered 2023-07-11: 1 via OPHTHALMIC

## 2023-07-11 MED ORDER — TETRACAINE HCL 0.5 % OP SOLN
1.0000 [drp] | OPHTHALMIC | Status: AC | PRN
  Administered 2023-07-11 (×3): 1 [drp] via OPHTHALMIC

## 2023-07-11 MED ORDER — LACTATED RINGERS IV SOLN: INTRAVENOUS | Status: DC

## 2023-07-11 MED ORDER — MOXIFLOXACIN HCL 5 MG/ML IO SOLN
INTRAOCULAR | Status: DC | PRN
  Administered 2023-07-11: .3 mL via INTRACAMERAL

## 2023-07-11 MED ORDER — DEXAMETHASONE 0.4 MG OP INST
VAGINAL_INSERT | OPHTHALMIC | Status: DC | PRN
  Administered 2023-07-11: .4 mg via OPHTHALMIC

## 2023-07-11 MED ORDER — TROPICAMIDE 1 % OP SOLN
1.0000 [drp] | OPHTHALMIC | Status: AC | PRN
  Administered 2023-07-11 (×3): 1 [drp] via OPHTHALMIC

## 2023-07-11 SURGICAL SUPPLY — 13 items
CATARACT SUITE SIGHTPATH (MISCELLANEOUS) ×1 IMPLANT
CLOTH BEACON ORANGE TIMEOUT ST (SAFETY) ×1 IMPLANT
EYE SHIELD UNIVERSAL CLEAR (GAUZE/BANDAGES/DRESSINGS) IMPLANT
FEE CATARACT SUITE SIGHTPATH (MISCELLANEOUS) ×1 IMPLANT
GLOVE BIOGEL PI IND STRL 6.5 (GLOVE) IMPLANT
GLOVE BIOGEL PI IND STRL 7.0 (GLOVE) ×2 IMPLANT
LENS IOL TECNIS EYHANCE 22.0 (Intraocular Lens) IMPLANT
NDL HYPO 18GX1.5 BLUNT FILL (NEEDLE) ×1 IMPLANT
NEEDLE HYPO 18GX1.5 BLUNT FILL (NEEDLE) ×1 IMPLANT
PAD ARMBOARD POSITIONER FOAM (MISCELLANEOUS) ×1 IMPLANT
SYR TB 1ML LL NO SAFETY (SYRINGE) ×1 IMPLANT
TAPE SURG TRANSPORE 1 IN (GAUZE/BANDAGES/DRESSINGS) IMPLANT
WATER STERILE IRR 250ML POUR (IV SOLUTION) ×1 IMPLANT

## 2023-07-11 NOTE — Op Note (Signed)
 Date of procedure: 07/11/23  Pre-operative diagnosis: Visually significant age-related combined cataract, Left Eye (H25.812)  Post-operative diagnosis:  Visually significant age-related combined cataract, Left Eye (H25.812) 2.   Pain and inflammation following cataract surgery, Left Eye (H57.12)  Procedure:  Removal of cataract via phacoemulsification and insertion of intra-ocular lens Johnson [employer] Johnson DIB00 +22.0D into the capsular bag of the Left Eye 2. Placement of Dextenza  Implant, Left Lower Lid  Attending surgeon: Pleas Brill. Tanishi Nault, MD, MA  Anesthesia: MAC, Topical Akten  Complications: None  Estimated Blood Loss: <65mL (minimal)  Specimens: None  Implants: As above  Indications:  Visually significant age-related cataract, Left Eye  Procedure:  The patient was seen and identified in the pre-operative area. The operative eye was identified and dilated.  The operative eye was marked.  Topical anesthesia was administered to the operative eye.     The patient was then to the operative suite and placed in the supine position.  A timeout was performed confirming the patient, procedure to be performed, and all other relevant information.   The patient's face was prepped and draped in the usual fashion for intra-ocular surgery.  A lid speculum was placed into the operative eye and the surgical microscope moved into place and focused.  An inferotemporal paracentesis was created using a 20 gauge paracentesis blade.  Shugarcaine was injected into the anterior chamber.  Viscoelastic was injected into the anterior chamber.  A temporal clear-corneal main wound incision was created using a 2.21mm microkeratome.  A continuous curvilinear capsulorrhexis was initiated using an irrigating cystitome and completed using capsulorrhexis forceps.  Hydrodissection and hydrodeliniation were performed.  Viscoelastic was injected into the anterior chamber.  A phacoemulsification handpiece and a chopper as a  second instrument were used to remove the nucleus and epinucleus. The irrigation/aspiration handpiece was used to remove any remaining cortical material.   The capsular bag was reinflated with viscoelastic, checked, and found to be intact.  The intraocular lens was inserted into the capsular bag.  The irrigation/aspiration handpiece was used to remove any remaining viscoelastic.  The clear corneal wound and paracentesis wounds were then hydrated and checked with Weck-Cels to be watertight.  0.1mL of moxifloxacin was injected into the anterior chamber.  The lid-speculum was removed. The lower punctum was dilated. A Dextenza  implant was placed in the lower canaliculus without complication.   The drape was removed.  The patient's face was cleaned with a wet and dry 4x4.   A clear shield was taped over the eye. The patient was taken to the post-operative care unit in good condition, having tolerated the procedure well.  Post-Op Instructions: The patient will follow up at [facility] for a same day post-operative evaluation and will receive all other orders and instructions.

## 2023-07-11 NOTE — Discharge Instructions (Addendum)
 Please discharge patient when stable, will follow up today with Dr. June Leap at the [facility] office immediately following discharge.  Leave shield in place until visit.  All paperwork with discharge instructions will be given at the office.  [facility] Address:  [address]  [place], [place] [address]

## 2023-07-11 NOTE — Anesthesia Preprocedure Evaluation (Signed)
 Anesthesia Evaluation  Patient identified by MRN, date of birth, ID band Patient awake    Reviewed: Allergy & Precautions, H&P , NPO status , Patient's Chart, lab work & pertinent test results, reviewed documented beta blocker date and time   Airway Mallampati: II  TM Distance: >3 FB Neck ROM: full    Dental no notable dental hx.    Pulmonary neg pulmonary ROS   Pulmonary exam normal breath sounds clear to auscultation       Cardiovascular Exercise Tolerance: Good negative cardio ROS Normal cardiovascular exam Rhythm:regular Rate:Normal     Neuro/Psych Brain tumor CVA  negative psych ROS   GI/Hepatic negative GI ROS, Neg liver ROS,,,  Endo/Other  negative endocrine ROS    Renal/GU negative Renal ROS  negative genitourinary   Musculoskeletal  (+) Arthritis , Osteoarthritis,    Abdominal   Peds  Hematology negative hematology ROS (+)   Anesthesia Other Findings Osler-Weber-Rendu syndrome. Cancer  Reproductive/Obstetrics negative OB ROS                             Anesthesia Physical Anesthesia Plan  ASA: 3  Anesthesia Plan: MAC   Post-op Pain Management: Minimal or no pain anticipated   Induction:   PONV Risk Score and Plan:   Airway Management Planned: Nasal Cannula and Natural Airway  Additional Equipment: None  Intra-op Plan:   Post-operative Plan:   Informed Consent: I have reviewed the patients History and Physical, chart, labs and discussed the procedure including the risks, benefits and alternatives for the proposed anesthesia with the patient or authorized representative who has indicated his/her understanding and acceptance.     Dental Advisory Given  Plan Discussed with: CRNA  Anesthesia Plan Comments:         Anesthesia Quick Evaluation

## 2023-07-11 NOTE — Interval H&P Note (Signed)
 History and Physical Interval Note:  07/11/2023 11:16 AM  Danielle [name]  has presented today for surgery, with the diagnosis of nuclear sclerotic cataract, left eye.  The various methods of treatment have been discussed with the patient and family. After consideration of risks, benefits and other options for treatment, the patient has consented to  Procedure(s) with comments: PHACOEMULSIFICATION, CATARACT, WITH IOL INSERTION (Left) - CDE: INSERTION, STENT, DRUG-ELUTING, LACRIMAL CANALICULUS (Left) as a surgical intervention.  The patient's history has been reviewed, patient examined, no change in status, stable for surgery.  I have reviewed the patient's chart and labs.  Questions were answered to the patient's satisfaction.     Tarri Farm

## 2023-07-11 NOTE — Anesthesia Postprocedure Evaluation (Signed)
 Anesthesia Post Note  Patient: Danielle [name]  Procedure(s) Performed: PHACOEMULSIFICATION, CATARACT, WITH IOL INSERTION (Left: Eye) INSERTION, STENT, DRUG-ELUTING, LACRIMAL CANALICULUS (Left: Eye)  Patient location during evaluation: PACU Anesthesia Type: MAC Level of consciousness: awake and alert Pain management: pain level controlled Vital Signs Assessment: post-procedure vital signs reviewed and stable Respiratory status: spontaneous breathing, nonlabored ventilation, respiratory function stable and patient connected to nasal cannula oxygen Cardiovascular status: stable and blood pressure returned to baseline Postop Assessment: no apparent nausea or vomiting Anesthetic complications: no   There were no known notable events for this encounter.   Last Vitals:  Vitals:   07/11/23 1100 07/11/23 1145  BP:  (!) 122/59  Pulse: 74 67  Resp: (!) 27 12  Temp:  36.8 C  SpO2: 98% 98%    Last Pain:  Vitals:   07/11/23 1145  TempSrc: Oral  PainSc: 0-No pain                 Analicia Skibinski L Kedar Sedano

## 2023-07-11 NOTE — Transfer of Care (Signed)
 Immediate Anesthesia Transfer of Care Note  Patient: Danielle [name]  Procedure(s) Performed: PHACOEMULSIFICATION, CATARACT, WITH IOL INSERTION (Left: Eye) INSERTION, STENT, DRUG-ELUTING, LACRIMAL CANALICULUS (Left: Eye)  Patient Location: Short Stay  Anesthesia Type:MAC  Level of Consciousness: awake, alert , and oriented  Airway & Oxygen Therapy: Patient Spontanous Breathing  Post-op Assessment: Report given to RN and Post -op Vital signs reviewed and stable  Post vital signs: Reviewed and stable  Last Vitals:  Vitals Value Taken Time  BP    Temp    Pulse    Resp    SpO2      Last Pain:  Vitals:   07/11/23 1041  PainSc: 0-No pain         Complications: No notable events documented.

## 2023-07-12 ENCOUNTER — Encounter (HOSPITAL_COMMUNITY): Payer: Self-pay | Admitting: Ophthalmology

## 2023-07-12 DIAGNOSIS — M5033 Other cervical disc degeneration, cervicothoracic region: Secondary | ICD-10-CM | POA: Diagnosis not present

## 2023-07-12 DIAGNOSIS — M9901 Segmental and somatic dysfunction of cervical region: Secondary | ICD-10-CM | POA: Diagnosis not present

## 2023-07-13 DIAGNOSIS — M5033 Other cervical disc degeneration, cervicothoracic region: Secondary | ICD-10-CM | POA: Diagnosis not present

## 2023-07-13 DIAGNOSIS — M9901 Segmental and somatic dysfunction of cervical region: Secondary | ICD-10-CM | POA: Diagnosis not present

## 2023-07-14 DIAGNOSIS — Z9189 Other specified personal risk factors, not elsewhere classified: Secondary | ICD-10-CM | POA: Diagnosis not present

## 2023-07-14 DIAGNOSIS — Q282 Arteriovenous malformation of cerebral vessels: Secondary | ICD-10-CM | POA: Diagnosis not present

## 2023-07-14 DIAGNOSIS — Z8774 Personal history of (corrected) congenital malformations of heart and circulatory system: Secondary | ICD-10-CM | POA: Diagnosis not present

## 2023-07-14 DIAGNOSIS — M5033 Other cervical disc degeneration, cervicothoracic region: Secondary | ICD-10-CM | POA: Diagnosis not present

## 2023-07-14 DIAGNOSIS — Z8673 Personal history of transient ischemic attack (TIA), and cerebral infarction without residual deficits: Secondary | ICD-10-CM | POA: Diagnosis not present

## 2023-07-14 DIAGNOSIS — D500 Iron deficiency anemia secondary to blood loss (chronic): Secondary | ICD-10-CM | POA: Diagnosis not present

## 2023-07-14 DIAGNOSIS — Q8503 Schwannomatosis: Secondary | ICD-10-CM | POA: Diagnosis not present

## 2023-07-14 DIAGNOSIS — G510 Bell's palsy: Secondary | ICD-10-CM | POA: Diagnosis not present

## 2023-07-14 DIAGNOSIS — R0683 Snoring: Secondary | ICD-10-CM | POA: Diagnosis not present

## 2023-07-14 DIAGNOSIS — M9901 Segmental and somatic dysfunction of cervical region: Secondary | ICD-10-CM | POA: Diagnosis not present

## 2023-07-14 DIAGNOSIS — I780 Hereditary hemorrhagic telangiectasia: Secondary | ICD-10-CM | POA: Diagnosis not present

## 2023-07-14 DIAGNOSIS — Z8719 Personal history of other diseases of the digestive system: Secondary | ICD-10-CM | POA: Diagnosis not present

## 2023-07-18 DIAGNOSIS — M9901 Segmental and somatic dysfunction of cervical region: Secondary | ICD-10-CM | POA: Diagnosis not present

## 2023-07-18 DIAGNOSIS — H25811 Combined forms of age-related cataract, right eye: Secondary | ICD-10-CM | POA: Diagnosis not present

## 2023-07-18 DIAGNOSIS — M5033 Other cervical disc degeneration, cervicothoracic region: Secondary | ICD-10-CM | POA: Diagnosis not present

## 2023-07-19 DIAGNOSIS — L570 Actinic keratosis: Secondary | ICD-10-CM | POA: Diagnosis not present

## 2023-07-19 DIAGNOSIS — L821 Other seborrheic keratosis: Secondary | ICD-10-CM | POA: Diagnosis not present

## 2023-07-19 DIAGNOSIS — L814 Other melanin hyperpigmentation: Secondary | ICD-10-CM | POA: Diagnosis not present

## 2023-07-19 DIAGNOSIS — Z85828 Personal history of other malignant neoplasm of skin: Secondary | ICD-10-CM | POA: Diagnosis not present

## 2023-07-20 ENCOUNTER — Encounter (HOSPITAL_COMMUNITY)
Admission: RE | Admit: 2023-07-20 | Discharge: 2023-07-20 | Disposition: A | Discharge status: Home / Self Care | Source: Ambulatory Visit | Attending: Ophthalmology | Admitting: Ophthalmology

## 2023-07-20 ENCOUNTER — Encounter (HOSPITAL_COMMUNITY): Payer: Self-pay

## 2023-07-20 DIAGNOSIS — M9901 Segmental and somatic dysfunction of cervical region: Secondary | ICD-10-CM | POA: Diagnosis not present

## 2023-07-20 DIAGNOSIS — M5033 Other cervical disc degeneration, cervicothoracic region: Secondary | ICD-10-CM | POA: Diagnosis not present

## 2023-07-20 DIAGNOSIS — G510 Bell's palsy: Secondary | ICD-10-CM | POA: Diagnosis not present

## 2023-07-21 DIAGNOSIS — M5033 Other cervical disc degeneration, cervicothoracic region: Secondary | ICD-10-CM | POA: Diagnosis not present

## 2023-07-21 DIAGNOSIS — M9901 Segmental and somatic dysfunction of cervical region: Secondary | ICD-10-CM | POA: Diagnosis not present

## 2023-07-21 NOTE — H&P (Signed)
 Surgical History & Physical  Patient Name: Sukaina Toothaker  DOB: [birth year]  Surgery: Cataract extraction with intraocular lens implant phacoemulsification; Right Eye Surgeon: Tarri Farm MD Surgery Date: 07/25/2023 Pre-Op Date: 07/18/2023  HPI: A 89 Yr. old female patient 1. The patient is returning after cataract surgery. The left eye is affected. Status post cataract surgery, which began 1 week ago: Since the last visit, the affected area feels improvement. The patient's vision is improved. The condition's severity is constant. Patient is following medication instructions. 2.  The patient is returning for a cataract follow-up of the right eye. Since the last visit, the affected area is tolerating. The patient's vision is blurry. The condition's severity is constant. Patient is not taking medications. This is negatively affecting the patient's quality of life and the patient is unable to function adequately in life with the current level of vision. HPI Completed by Dr. Tarri Farm  Medical History: Dry Eyes Cataracts  Arthritis Cancer Heart Problem LDL Stroke anemia, anxiety, depression  Review of Systems Cardiovascular High Blood Pressure, palpatations Musculoskeletal arthritis pains Neurological Stroke Psychiatry Anxiety, Depression All recorded systems are negative except as noted above.  Social Never smoked  Medication Prednisolone-moxiflox-bromfen,  Fluconazole, Sertraline, Nitroglycerin, Esomeprazole magnesium, Rosuvastatin, Mupirocin  Sx/Procedures Weight in lid LUL, Phaco c IOL OS - dextenza ,  Hysterectomy, C Section x2  Drug Allergies  Sulfa (Sulfonamide Antibiotics), Codeine  History & Physical: Heent: cataract NECK: supple without bruits LUNGS: lungs clear to auscultation CV: regular rate and rhythm Abdomen: soft and non-tender  Impression & Plan: Assessment: 1.  CATARACT EXTRACTION STATUS; Left Eye (Z98.42) 2.  COMBINED FORMS AGE RELATED CATARACT;  Right Eye (H25.811) 3.  EXPOSURE KERATOCONJUNCTIVITIS; Left Eye [phone])  Plan: 1.  1 week after cataract surgery. Doing well with improved vision and normal eye pressure. Call with any problems or concerns. Stop drops - Dextenza . Call with any concerning symptoms.  2.  Cataract accounts for the patient's decreased vision. This visual impairment is not correctable with a tolerable change in glasses or contact lenses. Cataract surgery with an implantation of a new lens should significantly improve the visual and functional status of the patient.Discussed all risks, benefits, alternatives, and potential complications. Discussed the procedures and recovery. Patient desires to have surgery. A-scan ordered and performed today for intra-ocular lens calculations. The surgery will be performed in order to improve vision for driving, reading, and for eye examinations. Recommend phacoemulsification with intra-ocular lens. Recommend Dextenza  for post-operative pain and inflammation. Right Eye. Surgery required to correct imbalance of vision. Dilates well - shugarcaine or Lidocaine +Omidira by protocol  3.  Preservative Free Artificial tears 1 drop 2-3x/day. Nighttime ointment.

## 2023-07-25 ENCOUNTER — Encounter (HOSPITAL_COMMUNITY): Payer: Self-pay | Admitting: Ophthalmology

## 2023-07-25 ENCOUNTER — Ambulatory Visit (HOSPITAL_COMMUNITY): Payer: Self-pay | Admitting: Anesthesiology

## 2023-07-25 ENCOUNTER — Ambulatory Visit (HOSPITAL_BASED_OUTPATIENT_CLINIC_OR_DEPARTMENT_OTHER): Payer: Self-pay | Admitting: Anesthesiology

## 2023-07-25 ENCOUNTER — Other Ambulatory Visit: Payer: Self-pay

## 2023-07-25 ENCOUNTER — Ambulatory Visit (HOSPITAL_COMMUNITY)
Admission: RE | Admit: 2023-07-25 | Discharge: 2023-07-25 | Disposition: A | Discharge status: Home / Self Care | Attending: Ophthalmology | Admitting: Ophthalmology

## 2023-07-25 ENCOUNTER — Encounter (HOSPITAL_COMMUNITY)
Admission: RE | Disposition: A | Discharge status: Home / Self Care | Payer: Self-pay | Source: Home / Self Care | Attending: Ophthalmology

## 2023-07-25 DIAGNOSIS — Z8673 Personal history of transient ischemic attack (TIA), and cerebral infarction without residual deficits: Secondary | ICD-10-CM | POA: Insufficient documentation

## 2023-07-25 DIAGNOSIS — H25811 Combined forms of age-related cataract, right eye: Secondary | ICD-10-CM | POA: Insufficient documentation

## 2023-07-25 DIAGNOSIS — Z86011 Personal history of benign neoplasm of the brain: Secondary | ICD-10-CM | POA: Diagnosis not present

## 2023-07-25 DIAGNOSIS — E7800 Pure hypercholesterolemia, unspecified: Secondary | ICD-10-CM | POA: Diagnosis not present

## 2023-07-25 DIAGNOSIS — M1990 Unspecified osteoarthritis, unspecified site: Secondary | ICD-10-CM | POA: Insufficient documentation

## 2023-07-25 DIAGNOSIS — I780 Hereditary hemorrhagic telangiectasia: Secondary | ICD-10-CM | POA: Diagnosis not present

## 2023-07-25 DIAGNOSIS — I679 Cerebrovascular disease, unspecified: Secondary | ICD-10-CM

## 2023-07-25 DIAGNOSIS — H5711 Ocular pain, right eye: Secondary | ICD-10-CM | POA: Diagnosis not present

## 2023-07-25 DIAGNOSIS — H2511 Age-related nuclear cataract, right eye: Secondary | ICD-10-CM | POA: Diagnosis present

## 2023-07-25 HISTORY — PX: CATARACT EXTRACTION W/PHACO: SHX586

## 2023-07-25 HISTORY — PX: INSERTION, STENT, DRUG-ELUTING, LACRIMAL CANALICULUS: SHX7453

## 2023-07-25 SURGERY — PHACOEMULSIFICATION, CATARACT, WITH IOL INSERTION
Anesthesia: Monitor Anesthesia Care | Site: Eye | Laterality: Right

## 2023-07-25 MED ORDER — MIDAZOLAM HCL 2 MG/2ML IJ SOLN
INTRAMUSCULAR | Status: AC
  Filled 2023-07-25: qty 2

## 2023-07-25 MED ORDER — MIDAZOLAM HCL 2 MG/2ML IJ SOLN
INTRAMUSCULAR | Status: DC | PRN
  Administered 2023-07-25: 2 mg via INTRAVENOUS

## 2023-07-25 MED ORDER — LIDOCAINE HCL 3.5 % OP GEL
1.0000 | Freq: Once | OPHTHALMIC | Status: AC
  Administered 2023-07-25: 1 via OPHTHALMIC

## 2023-07-25 MED ORDER — SODIUM HYALURONATE 10 MG/ML IO SOLUTION
PREFILLED_SYRINGE | INTRAOCULAR | Status: DC | PRN
  Administered 2023-07-25: .85 mL via INTRAOCULAR

## 2023-07-25 MED ORDER — DEXAMETHASONE 0.4 MG OP INST
VAGINAL_INSERT | OPHTHALMIC | Status: AC
Start: 2023-07-25 — End: 2023-07-25
  Filled 2023-07-25: qty 1

## 2023-07-25 MED ORDER — SODIUM HYALURONATE 23MG/ML IO SOSY
PREFILLED_SYRINGE | INTRAOCULAR | Status: DC | PRN
  Administered 2023-07-25: .6 mL via INTRAOCULAR

## 2023-07-25 MED ORDER — PHENYLEPHRINE HCL 2.5 % OP SOLN
1.0000 [drp] | OPHTHALMIC | Status: AC | PRN
  Administered 2023-07-25 (×3): 1 [drp] via OPHTHALMIC

## 2023-07-25 MED ORDER — BSS IO SOLN
INTRAOCULAR | Status: DC | PRN
  Administered 2023-07-25: 15 mL via INTRAOCULAR

## 2023-07-25 MED ORDER — MOXIFLOXACIN HCL 5 MG/ML IO SOLN
INTRAOCULAR | Status: DC | PRN
  Administered 2023-07-25: .2 mL via INTRACAMERAL

## 2023-07-25 MED ORDER — TROPICAMIDE 1 % OP SOLN
1.0000 [drp] | OPHTHALMIC | Status: AC | PRN
  Administered 2023-07-25 (×3): 1 [drp] via OPHTHALMIC

## 2023-07-25 MED ORDER — TETRACAINE HCL 0.5 % OP SOLN
1.0000 [drp] | OPHTHALMIC | Status: AC | PRN
  Administered 2023-07-25 (×3): 1 [drp] via OPHTHALMIC

## 2023-07-25 MED ORDER — POVIDONE-IODINE 5 % OP SOLN
OPHTHALMIC | Status: DC | PRN
  Administered 2023-07-25: 1 via OPHTHALMIC

## 2023-07-25 MED ORDER — DEXAMETHASONE 0.4 MG OP INST
VAGINAL_INSERT | OPHTHALMIC | Status: DC | PRN
  Administered 2023-07-25: .4 mg via OPHTHALMIC

## 2023-07-25 MED ORDER — SODIUM CHLORIDE 0.9% FLUSH
INTRAVENOUS | Status: DC | PRN
Start: 2023-07-25 — End: 2023-07-25
  Administered 2023-07-25: 10 mL via INTRAVENOUS

## 2023-07-25 MED ORDER — STERILE WATER FOR IRRIGATION IR SOLN
Status: DC | PRN
  Administered 2023-07-25: 250 mL

## 2023-07-25 MED ORDER — LACTATED RINGERS IV SOLN: INTRAVENOUS | Status: DC

## 2023-07-25 MED ORDER — PHENYLEPHRINE-KETOROLAC 1-0.3 % IO SOLN
INTRAOCULAR | Status: DC | PRN
  Administered 2023-07-25: 500 mL via OPHTHALMIC

## 2023-07-25 SURGICAL SUPPLY — 12 items
CATARACT SUITE SIGHTPATH (MISCELLANEOUS) ×1 IMPLANT
CLOTH BEACON ORANGE TIMEOUT ST (SAFETY) ×1 IMPLANT
EYE SHIELD UNIVERSAL CLEAR (GAUZE/BANDAGES/DRESSINGS) IMPLANT
FEE CATARACT SUITE SIGHTPATH (MISCELLANEOUS) ×1 IMPLANT
GLOVE BIOGEL PI IND STRL 7.0 (GLOVE) ×2 IMPLANT
LENS IOL TECNIS EYHANCE 22.5 (Intraocular Lens) IMPLANT
NDL HYPO 18GX1.5 BLUNT FILL (NEEDLE) ×1 IMPLANT
NEEDLE HYPO 18GX1.5 BLUNT FILL (NEEDLE) ×1 IMPLANT
PAD ARMBOARD POSITIONER FOAM (MISCELLANEOUS) ×1 IMPLANT
SYR TB 1ML LL NO SAFETY (SYRINGE) ×1 IMPLANT
TAPE SURG TRANSPORE 1 IN (GAUZE/BANDAGES/DRESSINGS) IMPLANT
WATER STERILE IRR 250ML POUR (IV SOLUTION) ×1 IMPLANT

## 2023-07-25 NOTE — Interval H&P Note (Signed)
 History and Physical Interval Note:  07/25/2023 8:14 AM  Danielle [name]  has presented today for surgery, with the diagnosis of nuclear sclerotic cataract, right eye.  The various methods of treatment have been discussed with the patient and family. After consideration of risks, benefits and other options for treatment, the patient has consented to  Procedure(s): PHACOEMULSIFICATION, CATARACT, WITH IOL INSERTION (Right) INSERTION, STENT, DRUG-ELUTING, LACRIMAL CANALICULUS (Right) as a surgical intervention.  The patient's history has been reviewed, patient examined, no change in status, stable for surgery.  I have reviewed the patient's chart and labs.  Questions were answered to the patient's satisfaction.     HARRIE AGENT

## 2023-07-25 NOTE — Anesthesia Preprocedure Evaluation (Signed)
 Anesthesia Evaluation  Patient identified by MRN, date of birth, ID band Patient awake    Reviewed: Allergy & Precautions, H&P , NPO status , Patient's Chart, lab work & pertinent test results, reviewed documented beta blocker date and time   Airway Mallampati: II  TM Distance: >3 FB Neck ROM: full    Dental no notable dental hx.    Pulmonary neg pulmonary ROS   Pulmonary exam normal breath sounds clear to auscultation       Cardiovascular Exercise Tolerance: Good negative cardio ROS Normal cardiovascular exam Rhythm:regular Rate:Normal     Neuro/Psych Brain tumor CVA  negative psych ROS   GI/Hepatic negative GI ROS, Neg liver ROS,,,  Endo/Other  negative endocrine ROS    Renal/GU negative Renal ROS  negative genitourinary   Musculoskeletal  (+) Arthritis , Osteoarthritis,    Abdominal   Peds  Hematology negative hematology ROS (+)   Anesthesia Other Findings Osler-Weber-Rendu syndrome. Cancer  Reproductive/Obstetrics negative OB ROS                             Anesthesia Physical Anesthesia Plan  ASA: 3  Anesthesia Plan: MAC   Post-op Pain Management: Minimal or no pain anticipated   Induction:   PONV Risk Score and Plan:   Airway Management Planned: Nasal Cannula and Natural Airway  Additional Equipment: None  Intra-op Plan:   Post-operative Plan:   Informed Consent: I have reviewed the patients History and Physical, chart, labs and discussed the procedure including the risks, benefits and alternatives for the proposed anesthesia with the patient or authorized representative who has indicated his/her understanding and acceptance.     Dental Advisory Given  Plan Discussed with: CRNA  Anesthesia Plan Comments:         Anesthesia Quick Evaluation

## 2023-07-25 NOTE — Discharge Instructions (Addendum)
 Please discharge patient when stable, will follow up today with Dr. June Leap at the [facility] office immediately following discharge.  Leave shield in place until visit.  All paperwork with discharge instructions will be given at the office.  [facility] Address:  [address]  [place], [place] [address]

## 2023-07-25 NOTE — Transfer of Care (Signed)
 Immediate Anesthesia Transfer of Care Note  Patient: Danielle [name]  Procedure(s) Performed: PHACOEMULSIFICATION, CATARACT, WITH IOL INSERTION (Right: Eye) INSERTION, STENT, DRUG-ELUTING, LACRIMAL CANALICULUS (Right: Eye)  Patient Location: Short Stay  Anesthesia Type:MAC  Level of Consciousness: awake, alert , and oriented  Airway & Oxygen Therapy: Patient Spontanous Breathing  Post-op Assessment: Report given to RN and Post -op Vital signs reviewed and stable  Post vital signs: Reviewed and stable  Last Vitals:  Vitals Value Taken Time  BP 116/52   Temp 36.9 C 07/25/23 08:39  Pulse 66 07/25/23 08:39  Resp 18 07/25/23 08:39  SpO2 96 % 07/25/23 08:39    Last Pain:  Vitals:   07/25/23 0839  TempSrc: Oral  PainSc: 0-No pain      Patients Stated Pain Goal: 6 (07/25/23 0839)  Complications: No notable events documented.

## 2023-07-25 NOTE — Anesthesia Postprocedure Evaluation (Signed)
 Anesthesia Post Note  Patient: Danielle [name]  Procedure(s) Performed: PHACOEMULSIFICATION, CATARACT, WITH IOL INSERTION (Right: Eye) INSERTION, STENT, DRUG-ELUTING, LACRIMAL CANALICULUS (Right: Eye)  Patient location during evaluation: Phase II Anesthesia Type: MAC Level of consciousness: awake Pain management: pain level controlled Vital Signs Assessment: post-procedure vital signs reviewed and stable Respiratory status: spontaneous breathing and respiratory function stable Cardiovascular status: blood pressure returned to baseline and stable Postop Assessment: no headache and no apparent nausea or vomiting Anesthetic complications: no Comments: Late entry   No notable events documented.   Last Vitals:  Vitals:   07/25/23 0839 07/25/23 0841  BP:  (!) 116/52  Pulse: 66   Resp: 18   Temp: 36.9 C   SpO2: 96%     Last Pain:  Vitals:   07/25/23 0839  TempSrc: Oral  PainSc: 0-No pain                 Yvonna JINNY Bosworth

## 2023-07-25 NOTE — Op Note (Signed)
 Date of procedure: 07/25/23  Pre-operative diagnosis:  Visually significant combined form age-related cataract, Right Eye (H25.811)  Post-operative diagnosis:   1. Visually significant combined form age-related cataract, Right Eye (H25.811) 2. Pain and inflammation following cataract surgery Right Eye (H57.11)  Procedure:  Removal of cataract via phacoemulsification and insertion of intra-ocular lens Johnson and Johnson DIB00 +22.5D into the capsular bag of the Right Eye 2. Placement of Dextenza  insert, Right Eye  Attending surgeon: Lynwood LABOR. Bertie Simien, MD, MA  Anesthesia: MAC, Topical Akten   Complications: None  Estimated Blood Loss: <26mL (minimal)  Specimens: None  Implants: As above  Indications:  Visually significant age-related cataract, Right Eye  Procedure:  The patient was seen and identified in the pre-operative area. The operative eye was identified and dilated.  The operative eye was marked.  Topical anesthesia was administered to the operative eye.     The patient was then to the operative suite and placed in the supine position.  A timeout was performed confirming the patient, procedure to be performed, and all other relevant information.   The patient's face was prepped and draped in the usual fashion for intra-ocular surgery.  A lid speculum was placed into the operative eye and the surgical microscope moved into place and focused.  A superotemporal paracentesis was created using a 20 gauge paracentesis blade. Omidria  was injected into the anterior chamber. Shugarcaine was injected into the anterior chamber.  Viscoelastic was injected into the anterior chamber.  A temporal clear-corneal main wound incision was created using a 2.71mm microkeratome.  A continuous curvilinear capsulorrhexis was initiated using an irrigating cystitome and completed using capsulorrhexis forceps.  Hydrodissection and hydrodeliniation were performed.  Viscoelastic was injected into the anterior  chamber.  A phacoemulsification handpiece and a chopper as a second instrument were used to remove the nucleus and epinucleus. The irrigation/aspiration handpiece was used to remove any remaining cortical material.   The capsular bag was reinflated with viscoelastic, checked, and found to be intact.  The intraocular lens was inserted into the capsular bag.  The irrigation/aspiration handpiece was used to remove any remaining viscoelastic.  The clear corneal wound and paracentesis wounds were then hydrated and checked with Weck-Cels to be watertight. 0.1mL of moxifloxacin  was injected into the anterior chamber.  The lid-speculum was removed. The lower punctum was dilated. A Dextenza  implant was placed in the lower canaliculus without complication.  The drape was removed.  The patient's face was cleaned with a wet and dry 4x4. A clear shield was taped over the eye. The patient was taken to the post-operative care unit in good condition, having tolerated the procedure well.  Post-Op Instructions: The patient will follow up at [facility] for a same day post-operative evaluation and will receive all other orders and instructions.

## 2023-07-26 ENCOUNTER — Encounter (HOSPITAL_COMMUNITY): Payer: Self-pay | Admitting: Ophthalmology

## 2023-07-26 DIAGNOSIS — M9901 Segmental and somatic dysfunction of cervical region: Secondary | ICD-10-CM | POA: Diagnosis not present

## 2023-07-26 DIAGNOSIS — M5033 Other cervical disc degeneration, cervicothoracic region: Secondary | ICD-10-CM | POA: Diagnosis not present

## 2023-07-27 DIAGNOSIS — M5033 Other cervical disc degeneration, cervicothoracic region: Secondary | ICD-10-CM | POA: Diagnosis not present

## 2023-07-27 DIAGNOSIS — M9901 Segmental and somatic dysfunction of cervical region: Secondary | ICD-10-CM | POA: Diagnosis not present

## 2023-08-04 DIAGNOSIS — I780 Hereditary hemorrhagic telangiectasia: Secondary | ICD-10-CM | POA: Diagnosis not present

## 2023-08-04 DIAGNOSIS — K5520 Angiodysplasia of colon without hemorrhage: Secondary | ICD-10-CM | POA: Diagnosis not present

## 2023-08-04 DIAGNOSIS — D500 Iron deficiency anemia secondary to blood loss (chronic): Secondary | ICD-10-CM | POA: Diagnosis not present

## 2023-08-08 DIAGNOSIS — M5033 Other cervical disc degeneration, cervicothoracic region: Secondary | ICD-10-CM | POA: Diagnosis not present

## 2023-08-08 DIAGNOSIS — M9901 Segmental and somatic dysfunction of cervical region: Secondary | ICD-10-CM | POA: Diagnosis not present

## 2023-08-16 DIAGNOSIS — G4733 Obstructive sleep apnea (adult) (pediatric): Secondary | ICD-10-CM | POA: Diagnosis not present

## 2023-08-18 DIAGNOSIS — G4733 Obstructive sleep apnea (adult) (pediatric): Secondary | ICD-10-CM | POA: Diagnosis not present

## 2023-09-01 DIAGNOSIS — Z9841 Cataract extraction status, right eye: Secondary | ICD-10-CM | POA: Diagnosis not present

## 2023-09-01 DIAGNOSIS — Z961 Presence of intraocular lens: Secondary | ICD-10-CM | POA: Diagnosis not present

## 2023-09-05 DIAGNOSIS — G4733 Obstructive sleep apnea (adult) (pediatric): Secondary | ICD-10-CM | POA: Diagnosis not present

## 2023-09-22 DIAGNOSIS — R944 Abnormal results of kidney function studies: Secondary | ICD-10-CM | POA: Diagnosis not present

## 2023-09-22 DIAGNOSIS — D649 Anemia, unspecified: Secondary | ICD-10-CM | POA: Diagnosis not present

## 2023-09-22 DIAGNOSIS — Z1329 Encounter for screening for other suspected endocrine disorder: Secondary | ICD-10-CM | POA: Diagnosis not present

## 2023-09-22 DIAGNOSIS — E7849 Other hyperlipidemia: Secondary | ICD-10-CM | POA: Diagnosis not present

## 2023-09-22 DIAGNOSIS — D509 Iron deficiency anemia, unspecified: Secondary | ICD-10-CM | POA: Diagnosis not present

## 2023-09-22 DIAGNOSIS — D529 Folate deficiency anemia, unspecified: Secondary | ICD-10-CM | POA: Diagnosis not present

## 2023-09-29 DIAGNOSIS — E7849 Other hyperlipidemia: Secondary | ICD-10-CM | POA: Diagnosis not present

## 2023-09-29 DIAGNOSIS — I780 Hereditary hemorrhagic telangiectasia: Secondary | ICD-10-CM | POA: Diagnosis not present

## 2023-09-29 DIAGNOSIS — Z23 Encounter for immunization: Secondary | ICD-10-CM | POA: Diagnosis not present

## 2023-09-29 DIAGNOSIS — Z0001 Encounter for general adult medical examination with abnormal findings: Secondary | ICD-10-CM | POA: Diagnosis not present

## 2023-09-29 DIAGNOSIS — E782 Mixed hyperlipidemia: Secondary | ICD-10-CM | POA: Diagnosis not present

## 2023-09-29 DIAGNOSIS — I770 Arteriovenous fistula, acquired: Secondary | ICD-10-CM | POA: Diagnosis not present

## 2023-09-29 DIAGNOSIS — Z6825 Body mass index (BMI) 25.0-25.9, adult: Secondary | ICD-10-CM | POA: Diagnosis not present

## 2023-09-29 DIAGNOSIS — Q8503 Schwannomatosis: Secondary | ICD-10-CM | POA: Diagnosis not present

## 2023-10-06 DIAGNOSIS — G4733 Obstructive sleep apnea (adult) (pediatric): Secondary | ICD-10-CM | POA: Diagnosis not present

## 2023-10-06 DIAGNOSIS — D500 Iron deficiency anemia secondary to blood loss (chronic): Secondary | ICD-10-CM | POA: Diagnosis not present

## 2023-10-11 DIAGNOSIS — J342 Deviated nasal septum: Secondary | ICD-10-CM | POA: Diagnosis not present

## 2023-10-11 DIAGNOSIS — R040 Epistaxis: Secondary | ICD-10-CM | POA: Diagnosis not present

## 2023-10-11 DIAGNOSIS — I780 Hereditary hemorrhagic telangiectasia: Secondary | ICD-10-CM | POA: Diagnosis not present

## 2023-10-11 DIAGNOSIS — J343 Hypertrophy of nasal turbinates: Secondary | ICD-10-CM | POA: Diagnosis not present

## 2023-10-11 DIAGNOSIS — R0982 Postnasal drip: Secondary | ICD-10-CM | POA: Diagnosis not present

## 2023-10-20 DIAGNOSIS — K31819 Angiodysplasia of stomach and duodenum without bleeding: Secondary | ICD-10-CM | POA: Diagnosis not present

## 2023-10-20 DIAGNOSIS — K5520 Angiodysplasia of colon without hemorrhage: Secondary | ICD-10-CM | POA: Diagnosis not present

## 2023-10-20 DIAGNOSIS — I780 Hereditary hemorrhagic telangiectasia: Secondary | ICD-10-CM | POA: Diagnosis not present

## 2023-10-20 DIAGNOSIS — K921 Melena: Secondary | ICD-10-CM | POA: Diagnosis not present

## 2023-10-21 DIAGNOSIS — D500 Iron deficiency anemia secondary to blood loss (chronic): Secondary | ICD-10-CM | POA: Diagnosis not present

## 2023-10-21 DIAGNOSIS — D649 Anemia, unspecified: Secondary | ICD-10-CM | POA: Diagnosis not present

## 2023-10-26 DIAGNOSIS — Z1231 Encounter for screening mammogram for malignant neoplasm of breast: Secondary | ICD-10-CM | POA: Diagnosis not present

## 2023-10-28 DIAGNOSIS — D500 Iron deficiency anemia secondary to blood loss (chronic): Secondary | ICD-10-CM | POA: Diagnosis not present

## 2023-10-28 DIAGNOSIS — D649 Anemia, unspecified: Secondary | ICD-10-CM | POA: Diagnosis not present

## 2023-11-02 DIAGNOSIS — D500 Iron deficiency anemia secondary to blood loss (chronic): Secondary | ICD-10-CM | POA: Diagnosis not present

## 2023-11-02 DIAGNOSIS — E7800 Pure hypercholesterolemia, unspecified: Secondary | ICD-10-CM | POA: Diagnosis not present

## 2023-11-02 DIAGNOSIS — I780 Hereditary hemorrhagic telangiectasia: Secondary | ICD-10-CM | POA: Diagnosis not present

## 2023-11-02 DIAGNOSIS — I639 Cerebral infarction, unspecified: Secondary | ICD-10-CM | POA: Diagnosis not present

## 2023-11-04 DIAGNOSIS — Z8673 Personal history of transient ischemic attack (TIA), and cerebral infarction without residual deficits: Secondary | ICD-10-CM | POA: Diagnosis not present

## 2023-11-04 DIAGNOSIS — G43109 Migraine with aura, not intractable, without status migrainosus: Secondary | ICD-10-CM | POA: Diagnosis not present

## 2023-11-04 DIAGNOSIS — K31819 Angiodysplasia of stomach and duodenum without bleeding: Secondary | ICD-10-CM | POA: Diagnosis not present

## 2023-11-04 DIAGNOSIS — K219 Gastro-esophageal reflux disease without esophagitis: Secondary | ICD-10-CM | POA: Diagnosis not present

## 2023-11-04 DIAGNOSIS — K31811 Angiodysplasia of stomach and duodenum with bleeding: Secondary | ICD-10-CM | POA: Diagnosis not present

## 2023-11-04 DIAGNOSIS — D649 Anemia, unspecified: Secondary | ICD-10-CM | POA: Diagnosis not present

## 2023-11-04 DIAGNOSIS — G709 Myoneural disorder, unspecified: Secondary | ICD-10-CM | POA: Diagnosis not present

## 2023-11-04 DIAGNOSIS — K5520 Angiodysplasia of colon without hemorrhage: Secondary | ICD-10-CM | POA: Diagnosis not present

## 2023-11-04 DIAGNOSIS — R040 Epistaxis: Secondary | ICD-10-CM | POA: Diagnosis not present

## 2023-11-04 DIAGNOSIS — I780 Hereditary hemorrhagic telangiectasia: Secondary | ICD-10-CM | POA: Diagnosis not present

## 2023-11-05 DIAGNOSIS — G4733 Obstructive sleep apnea (adult) (pediatric): Secondary | ICD-10-CM | POA: Diagnosis not present

## 2023-11-10 DIAGNOSIS — G4733 Obstructive sleep apnea (adult) (pediatric): Secondary | ICD-10-CM | POA: Diagnosis not present

## 2023-11-10 DIAGNOSIS — G510 Bell's palsy: Secondary | ICD-10-CM | POA: Diagnosis not present

## 2023-11-10 DIAGNOSIS — R0683 Snoring: Secondary | ICD-10-CM | POA: Diagnosis not present

## 2023-11-17 DIAGNOSIS — E559 Vitamin D deficiency, unspecified: Secondary | ICD-10-CM | POA: Diagnosis not present

## 2023-11-17 DIAGNOSIS — D500 Iron deficiency anemia secondary to blood loss (chronic): Secondary | ICD-10-CM | POA: Diagnosis not present

## 2023-12-15 DIAGNOSIS — D500 Iron deficiency anemia secondary to blood loss (chronic): Secondary | ICD-10-CM | POA: Diagnosis not present

## 2024-01-05 DIAGNOSIS — G4733 Obstructive sleep apnea (adult) (pediatric): Secondary | ICD-10-CM | POA: Diagnosis not present
